# Patient Record
Sex: Male | Born: 1975 | Race: Asian | Hispanic: No | Marital: Married | Smoking: Never smoker
Health system: Southern US, Community
[De-identification: ages and names within clinical notes are randomized; demographics above are authoritative.]

## PROBLEM LIST (undated history)

## (undated) DIAGNOSIS — Z87898 Personal history of other specified conditions: Secondary | ICD-10-CM

## (undated) HISTORY — DX: Personal history of other specified conditions: Z87.898

## (undated) HISTORY — PX: NO PAST SURGERIES: SHX2092

---

## 2006-02-06 ENCOUNTER — Encounter: Payer: Self-pay | Admitting: Internal Medicine

## 2006-12-13 ENCOUNTER — Ambulatory Visit: Payer: Self-pay | Admitting: Internal Medicine

## 2006-12-13 DIAGNOSIS — R634 Abnormal weight loss: Secondary | ICD-10-CM

## 2006-12-16 ENCOUNTER — Encounter (INDEPENDENT_AMBULATORY_CARE_PROVIDER_SITE_OTHER): Payer: Self-pay | Admitting: *Deleted

## 2007-09-07 ENCOUNTER — Ambulatory Visit: Payer: Self-pay | Admitting: Internal Medicine

## 2007-09-07 LAB — CONVERTED CEMR LAB
Bilirubin Urine: NEGATIVE
Ketones, urine, test strip: NEGATIVE
Specific Gravity, Urine: <1.005
Urobilinogen, UA: NEGATIVE

## 2007-09-11 LAB — CONVERTED CEMR LAB
AST: 17 units/L (ref 0–37)
Basophils Absolute: 0 10*3/uL (ref 0.0–0.1)
Bilirubin, Direct: 0.1 mg/dL (ref 0.0–0.3)
Cholesterol: 165 mg/dL (ref 0–200)
Eosinophils Absolute: 0.2 10*3/uL (ref 0.0–0.6)
GFR calc Af Amer: 127 mL/min
GFR calc non Af Amer: 105 mL/min
Glucose, Bld: 86 mg/dL (ref 70–99)
HDL: 43.5 mg/dL (ref >39.0)
Hemoglobin: 15.1 g/dL (ref 13.0–17.0)
Iron: 135 ug/dL (ref 42–165)
LDL Cholesterol: 98 mg/dL (ref 0–99)
Lymphocytes Relative: 40.1 % (ref 12.0–46.0)
MCHC: 32.8 g/dL (ref 30.0–36.0)
Monocytes Absolute: 0.4 10*3/uL (ref 0.2–0.7)
Monocytes Relative: 8.8 % (ref 3.0–11.0)
Neutro Abs: 2.2 10*3/uL (ref 1.4–7.7)
Saturation Ratios: 32.6 % (ref 20.0–50.0)
Sed Rate: 3 mm/hr (ref 0–20)
Sodium: 140 meq/L (ref 135–145)
TSH: 0.68 microintl units/mL (ref 0.35–5.50)
Vitamin B-12: 541 pg/mL (ref 211–911)

## 2007-09-12 ENCOUNTER — Encounter (INDEPENDENT_AMBULATORY_CARE_PROVIDER_SITE_OTHER): Payer: Self-pay | Admitting: *Deleted

## 2007-09-14 ENCOUNTER — Encounter (INDEPENDENT_AMBULATORY_CARE_PROVIDER_SITE_OTHER): Payer: Self-pay | Admitting: *Deleted

## 2007-10-04 ENCOUNTER — Telehealth (INDEPENDENT_AMBULATORY_CARE_PROVIDER_SITE_OTHER): Payer: Self-pay | Admitting: *Deleted

## 2007-10-04 ENCOUNTER — Ambulatory Visit: Payer: Self-pay | Admitting: Gastroenterology

## 2007-10-11 ENCOUNTER — Encounter (INDEPENDENT_AMBULATORY_CARE_PROVIDER_SITE_OTHER): Payer: Self-pay | Admitting: *Deleted

## 2007-10-12 ENCOUNTER — Ambulatory Visit: Payer: Self-pay | Admitting: Gastroenterology

## 2007-10-12 LAB — CONVERTED CEMR LAB
Fecal Occult Blood: NEGATIVE
OCCULT 2: NEGATIVE
OCCULT 3: NEGATIVE

## 2008-07-09 ENCOUNTER — Ambulatory Visit: Payer: Self-pay | Admitting: Internal Medicine

## 2009-05-06 IMAGING — CR DG CHEST 2V
3 series · 3 of 3 positions shown · non-contrast
Comparison: None.

CLINICAL DATA: Weight loss. 
 CHEST - 2 VIEW ? 09/07/07:

[view not recorded (1 of 3)]
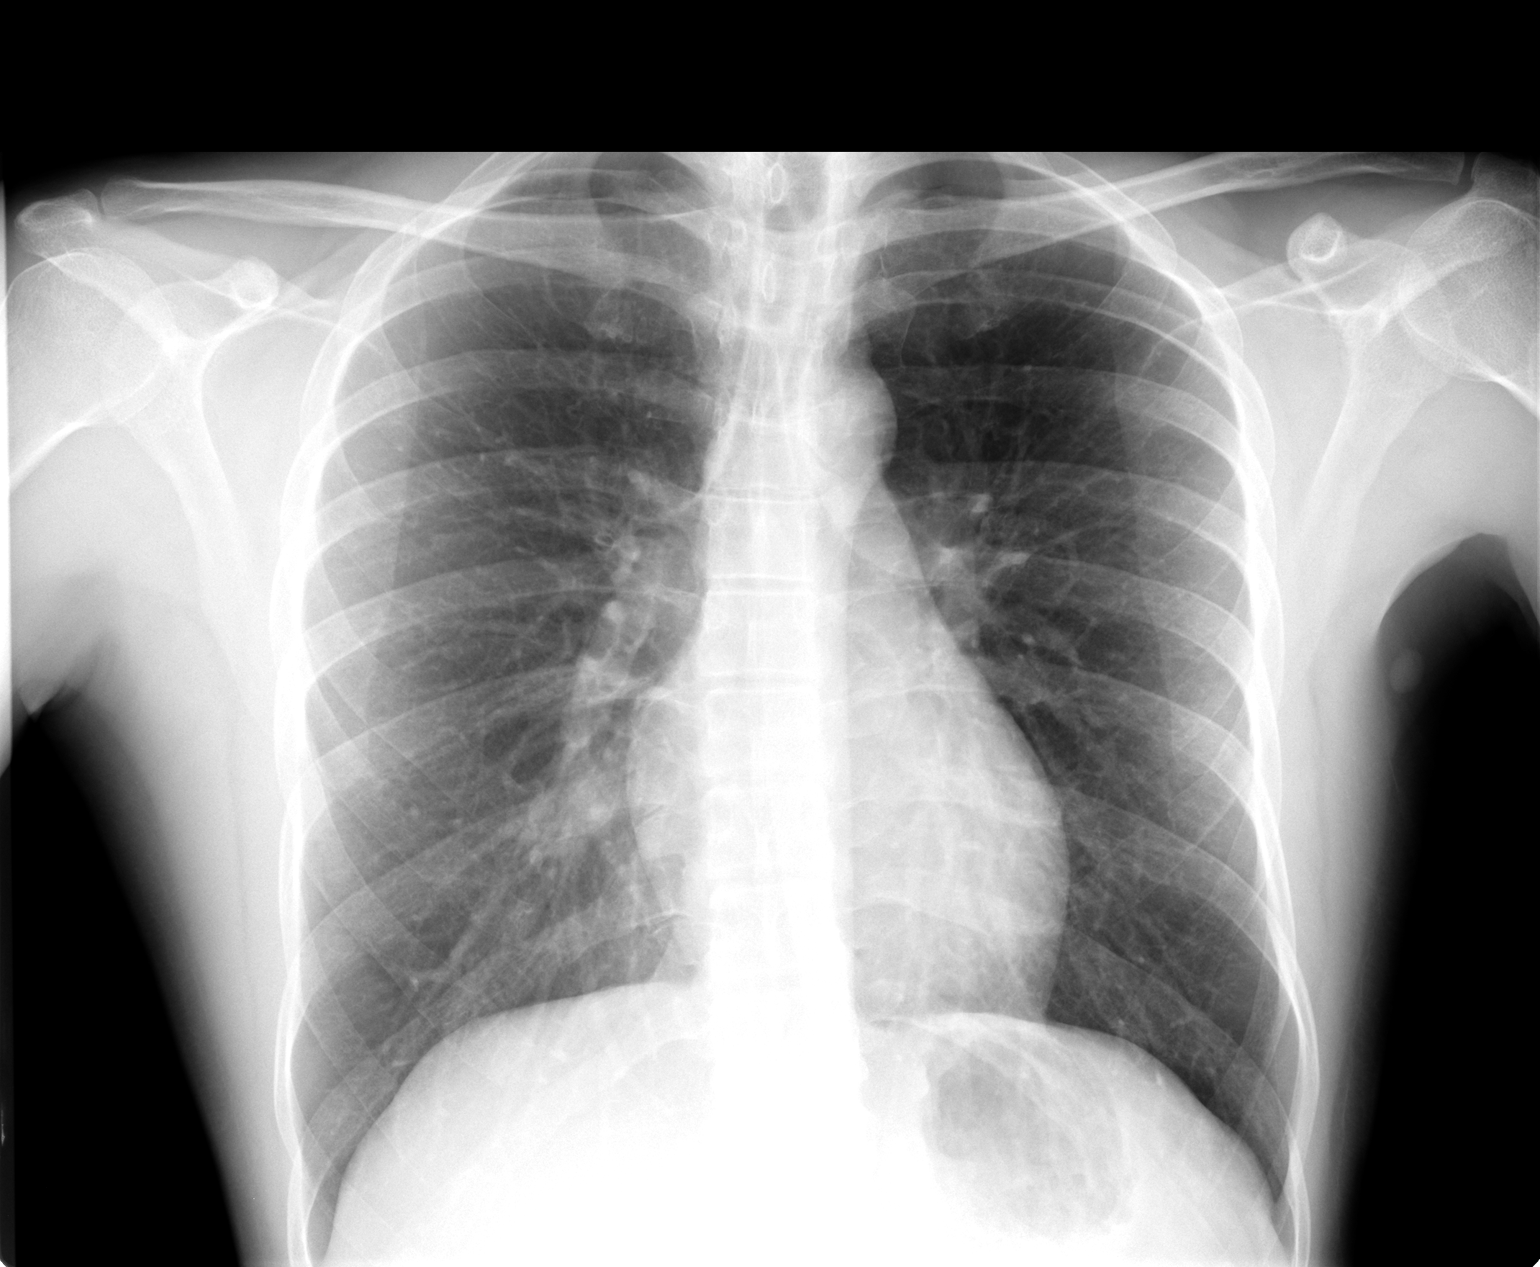

[view not recorded (2 of 3)]
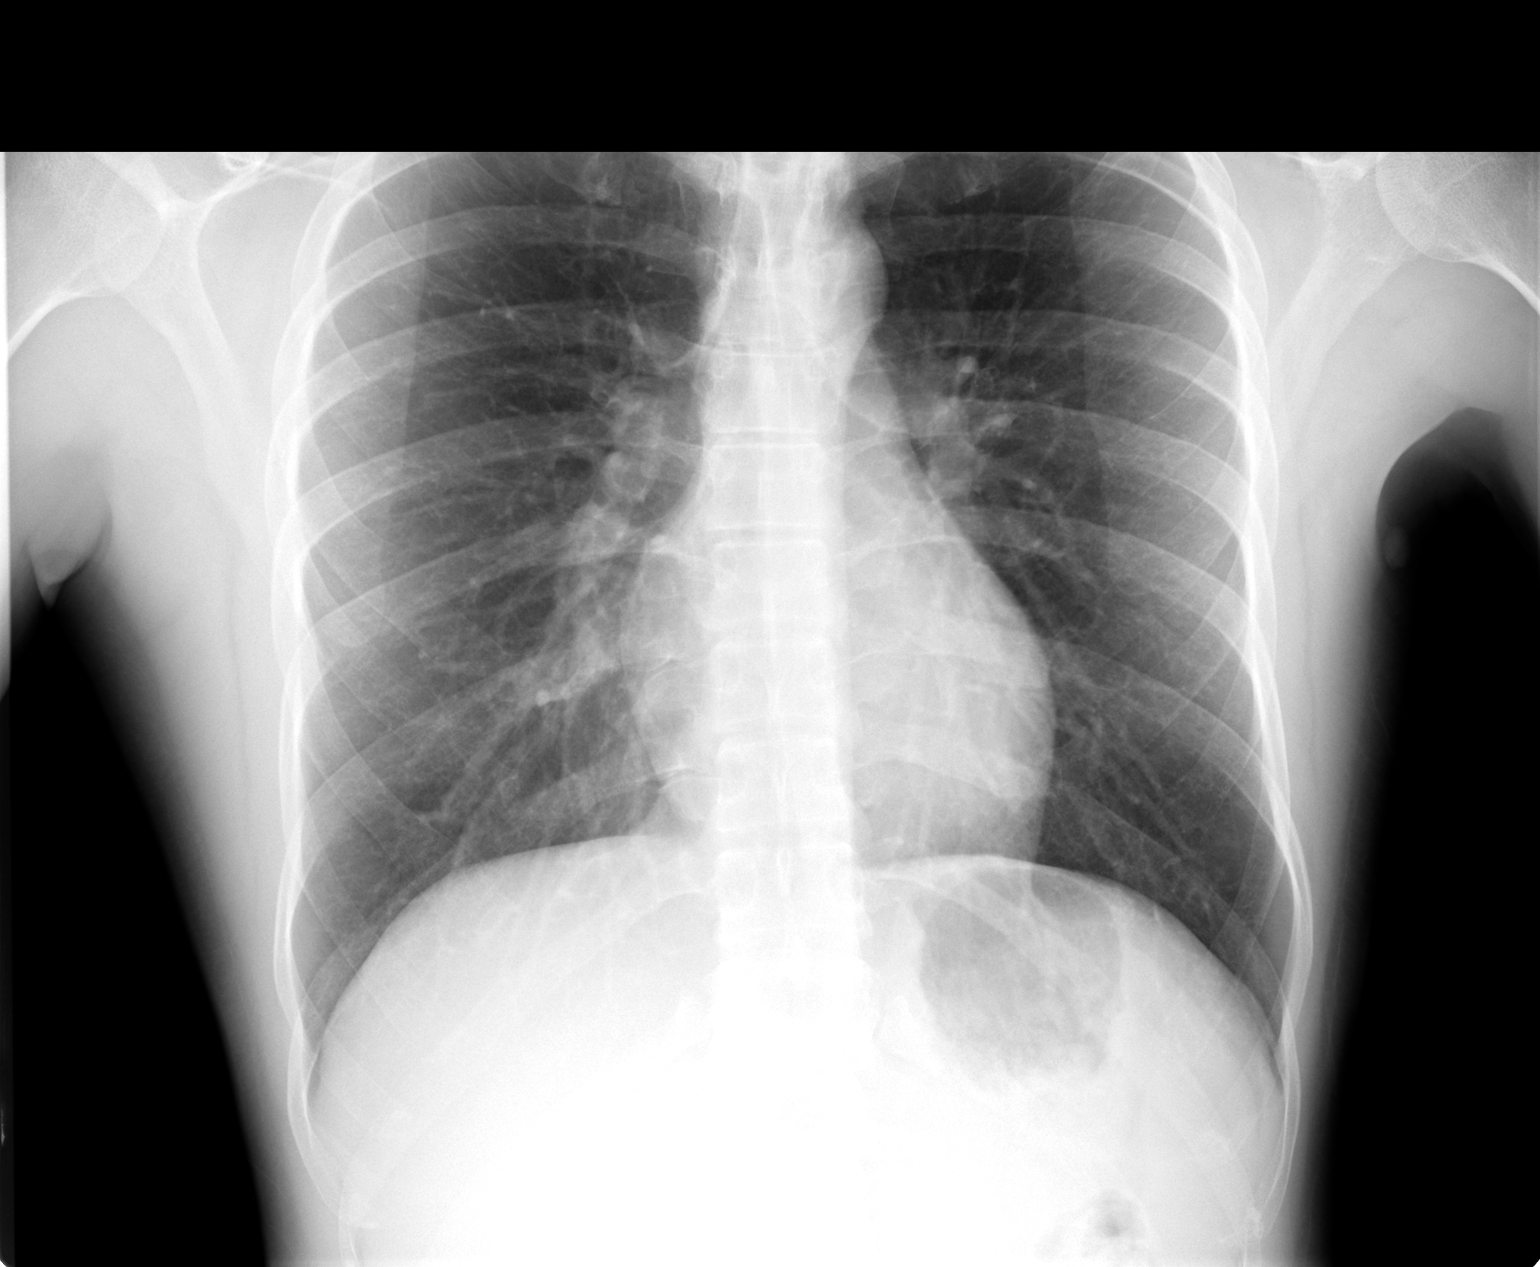

[view not recorded (3 of 3)]
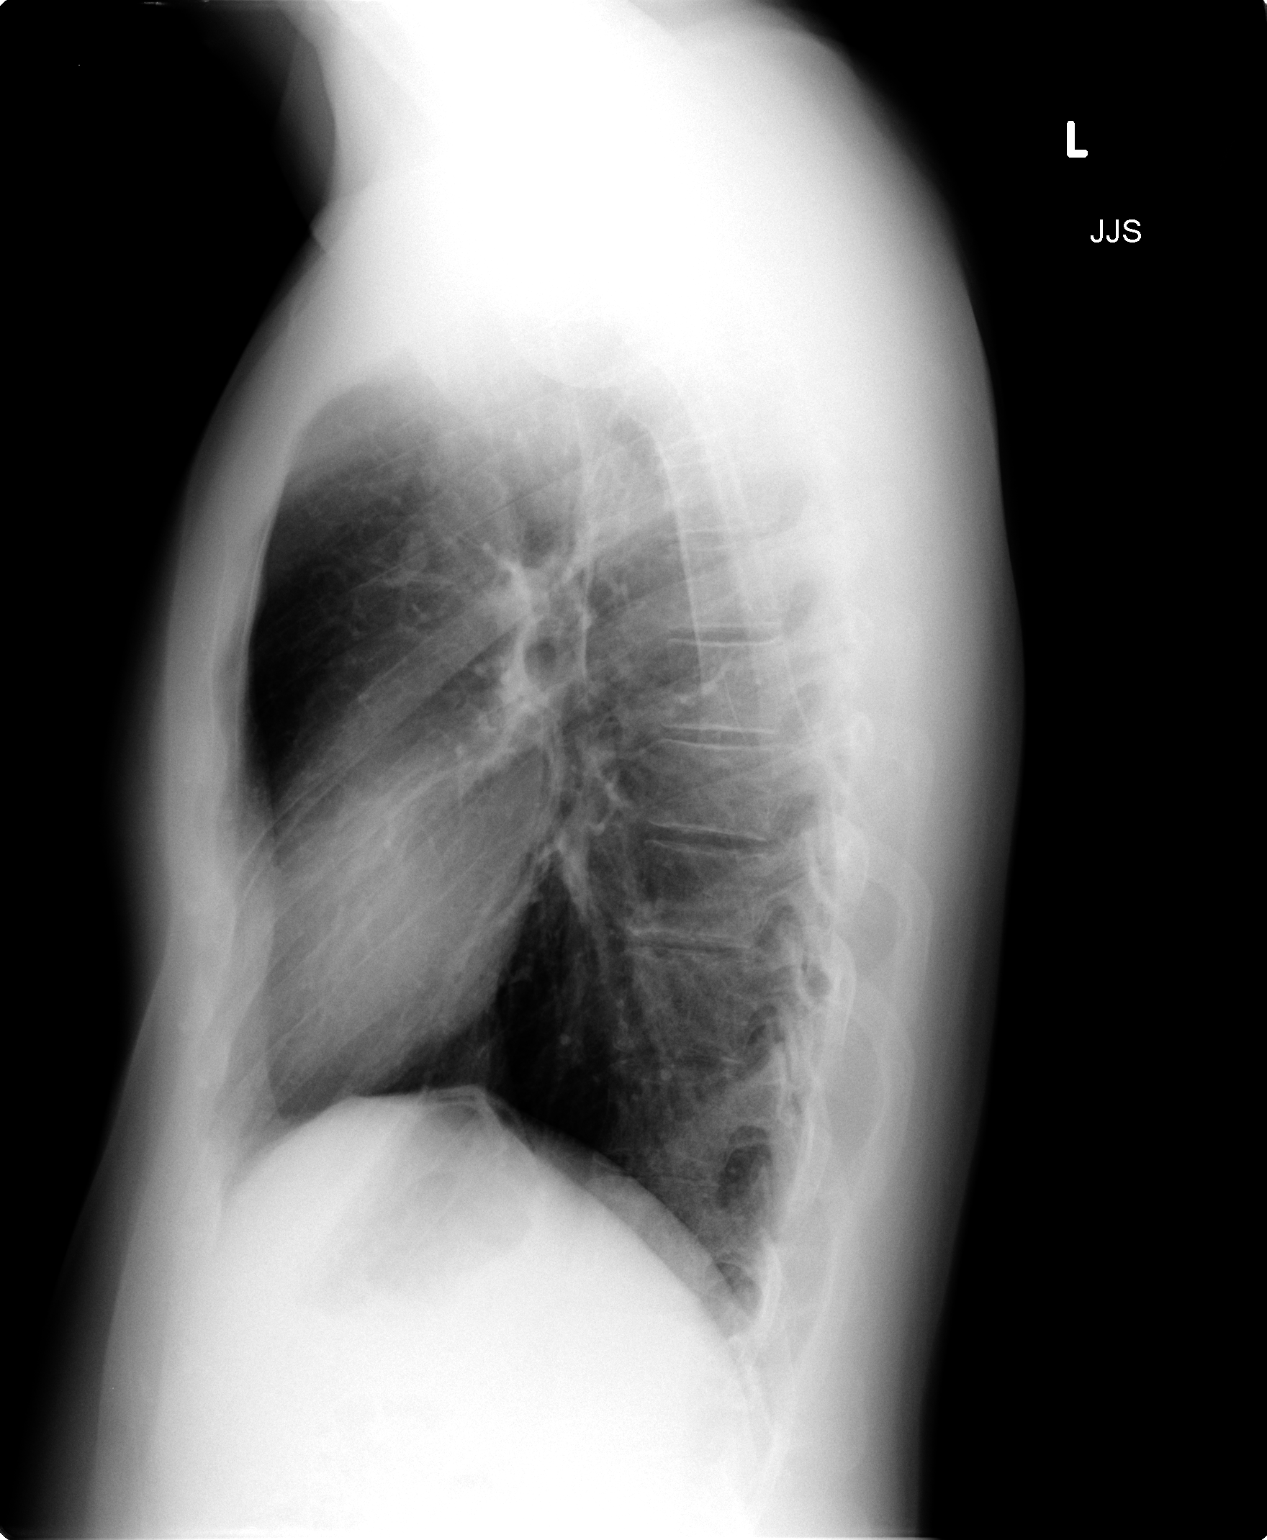

[3 of 3 positions shown; findings below may reference images not displayed]

FINDINGS: The heart size is normal.  The mediastinum is unremarkable.  The lungs are clear. No effusions.  Bony structures are unremarkable.
IMPRESSION: Normal chest.

## 2010-04-01 ENCOUNTER — Ambulatory Visit: Payer: Self-pay | Admitting: Internal Medicine

## 2010-04-06 ENCOUNTER — Ambulatory Visit: Payer: Self-pay | Admitting: Internal Medicine

## 2010-04-09 LAB — CONVERTED CEMR LAB
AST: 19 units/L (ref 0–37)
Basophils Relative: 0.7 % (ref 0.0–3.0)
CO2: 28 meq/L (ref 19–32)
Calcium: 9.5 mg/dL (ref 8.4–10.5)
Eosinophils Relative: 3.8 % (ref 0.0–5.0)
GFR calc non Af Amer: 99.94 mL/min (ref >60)
HDL: 45.4 mg/dL (ref >39.00)
Hemoglobin: 14.6 g/dL (ref 13.0–17.0)
LDL Cholesterol: 109 mg/dL — ABNORMAL HIGH (ref 0–99)
Lymphocytes Relative: 44.9 % (ref 12.0–46.0)
MCHC: 34.5 g/dL (ref 30.0–36.0)
Monocytes Relative: 10.5 % (ref 3.0–12.0)
Neutro Abs: 1.7 10*3/uL (ref 1.4–7.7)
RBC: 4.88 M/uL (ref 4.22–5.81)
Sodium: 140 meq/L (ref 135–145)
TSH: 0.62 microintl units/mL (ref 0.35–5.50)
Total CHOL/HDL Ratio: 4
WBC: 4.3 10*3/uL — ABNORMAL LOW (ref 4.5–10.5)

## 2010-07-22 NOTE — Assessment & Plan Note (Signed)
Summary: cpx/cbs   Vital Signs:  Patient profile:   35 year old male Height:      68.5 inches Weight:      146 pounds BMI:     21.96 Pulse rate:   77 / minute Pulse rhythm:   regular BP sitting:   112 / 82  (left arm) Cuff size:   regular  Vitals Entered By: Army Fossa CMA (April 01, 2010 1:51 PM) CC: CPX, not fasting, no complaints Comments flu shot [facility]    History of Present Illness: CPX  Current Medications (verified): 1)  None  Allergies (verified): No Known Drug Allergies  Past History:  Past Medical History: 2009- weight loss, positive Hemoccult:  extensive GI workup negative per patient, saw  Dr. Noe Gens in [facility] Negative colonoscopy and EGD  Past Surgical History: Reviewed history from 09/07/2007 and no changes required. no  Family History: Father: good health Mother: good health MI--no DM--no colon ca--no prostate ca--no  Social History: Married 2  child original from [place] Moved here in 2000 tobacco--no ETOH-- socially  exercise--no works at [employer]  Review of Systems       2 years ago was loosing wt , patient came to the conclusion it was stress d/t work, now has a more balance schedule and doing better  CV:  Denies chest pain or discomfort, palpitations, and swelling of feet. Resp:  Denies cough and shortness of breath. GI:  Denies bloody stools, diarrhea, nausea, and vomiting; occasionally heartburn . GU:  Denies dysuria, hematuria, urinary frequency, and urinary hesitancy.  Physical Exam  General:  alert, well-developed, and well-nourished.   Neck:  no masses, no thyromegaly, and normal carotid upstroke.   Lungs:  normal respiratory effort, no intercostal retractions, no accessory muscle use, and normal breath sounds.   Heart:  normal rate, regular rhythm, no murmur, and no gallop.   Abdomen:  soft, non-tender, no distention, no masses, no guarding, no rigidity, and no rebound tenderness.   Rectal:  No  external abnormalities noted. Normal sphincter tone. No rectal masses or tenderness. Prostate:  Prostate gland firm and smooth, no enlargement, nodularity, tenderness, mass, asymmetry or induration. Extremities:  no edema   Impression & Recommendations:  Problem # 1:  HEALTH SCREENING (ICD-V70.0) Td 07 got a flu shot already  Doing great prostate was enlarged in 2009, DRE normal today  Labs Information provided about diet. Encouraged to exercise 30 minutes daily  Problem # 2:  WEIGHT LOSS (ICD-783.21) history of weight loss and a positive Hemoccult in 2009 saw a GI in [facility], workup was negative. Weight loss resolved, patient thinks it was related to stress  Other Orders: Admin 1st Vaccine (16109) Flu Vaccine 53yrs + (60454)  Patient Instructions: 1)  please came back  fasting 2)  FLP, BMP, CBC, AST, ALT, TSH----dx v70 3)  Please schedule a follow-up appointment in 1 year.    Flu Vaccine Consent Questions     Do you have a history of severe allergic reactions to this vaccine? no    Any prior history of allergic reactions to egg and/or gelatin? no    Do you have a sensitivity to the preservative Thimersol? no    Do you have a past history of Guillan-Barre Syndrome? no    Do you currently have an acute febrile illness? no    Have you ever had a severe reaction to latex? no    Vaccine information given and explained to patient? yes  Are you currently pregnant? no    Lot Number:[id number]   Exp Date:12/19/2010   Site Given  Right Deltoid IM

## 2010-11-03 NOTE — Letter (Signed)
October 04, 2007    Vikki Ports   RE:  ODEL, SCHMID  MRN:  [id number]  /  DOB:  [birth year]   Dear Mr. Torrie Mayers:   It is my pleasure to have treated you recently as a new patient in my  office.  I appreciate your confidence and the opportunity to participate  in your care.   Since I do have a busy inpatient endoscopy schedule and office schedule,  my office hours vary weekly.  I am, however, available for emergency  calls every day through my office.  If I cannot promptly meet an urgent  office appointment, another one of our gastroenterologists will be able  to assist you.   My well-trained staff are prepared to help you at all times.  For  emergencies after office hours, a physician from our gastroenterology  section is always available through my 24-hour answering service.   While you are under my care, I encourage discussion of your questions  and concerns, and I will be happy to return your calls as soon as I am  available.   Once again, I welcome you as a new patient and I look forward to a happy  and healthy relationship.    Sincerely,      Barbette Hair. Arlyce Dice, MD,FACG  Electronically Signed   RDK/MedQ  DD: 10/04/2007  DT: 10/04/2007  Job #: [id number]

## 2010-11-03 NOTE — Letter (Signed)
October 04, 2007    Willow Ora, MD  [phone] [place], [place] [address]   RE:  JAKYE, MULLENS  MRN:  [id number]  /  DOB:  12-Jul-1975   Dear Dr. Drue Novel:   Upon your kind referral, I had the pleasure of evaluating your patient  and I am pleased to offer my findings.  I saw Mr. Briseno in the  office today.  Enclosed is a copy of my progress note that details my  findings and recommendations.   Thank you for the opportunity to participate in your patient's care.    Sincerely,      Barbette Hair. Arlyce Dice, MD,FACG  Electronically Signed    RDK/MedQ  DD: 10/04/2007  DT: 10/04/2007  Job #: [id number]

## 2010-11-03 NOTE — Assessment & Plan Note (Signed)
[facility]                         GASTROENTEROLOGY OFFICE NOTE   MEKHI, SONN                   MRN:          [id number]  DATE:10/04/2007                            DOB:          [birth year]    REASON FOR CONSULTATION:  Heme-positive stool and weight loss.   Mr. Patrick Berry is a pleasant 35 year old [place] male referred through  the courtesy of Dr. Drue Novel for evaluation.  His main complaint is weight  loss.  Over the past two years, he has lost 20 pounds.  He is uncertain  why.  He has no GI complaints including abdominal pain, nausea, pyrosis  or change of bowel habits.  Rarely he sees blood on the toilet tissue  when he eats spicy foods.   Laboratory including CBC, CMET and thyroid function tests ordered by Dr.  Drue Novel were unremarkable.  He was Hemoccult positive by Dr. Leta Jungling exam.  Appetite is excellent.  Altogether, he is feeling well.   PAST MEDICAL HISTORY:  Unremarkable.   FAMILY HISTORY:  Noncontributory.   MEDICATIONS:  He is on no medications.   ALLERGIES:  None.   SOCIAL HISTORY:  He does not smoke, he rarely drinks.  He is married.  He works as an Art gallery manager.   REVIEW OF SYSTEMS:  Reviewed and negative.   PHYSICAL EXAMINATION:  VITAL SIGNS:  Pulse 80, blood pressure 100/70,  weight 137.  HEENT:  EOMI.  PERRLA.  Sclerae are anicteric.  Conjunctivae are pink.  NECK:  Supple without thyromegaly, adenopathy or carotid bruits.  CHEST:  Clear to auscultation and percussion without adventitious  sounds.  CARDIAC:  Regular rhythm; normal S1 S2.  There are no murmurs, gallops  or rubs.  ABDOMEN:  Bowel sounds are normoactive.  Abdomen is soft, nontender and  nondistended.  There are no abdominal masses, tenderness, splenic  enlargement or hepatomegaly.  EXTREMITIES:  Full range of motion.  No cyanosis, clubbing or edema.  RECTAL:  Deferred.  MUSCULOSKELETAL:  No abnormalities.  NEUROLOGIC:  Nonfocal.  Patient is oriented x3.   IMPRESSION:  1. Weight loss.  Etiology for this is not apparent.  There are no      obvious metabolic abnormalities by his lab examination.      Malabsorption ought to be considered, though, again, there is no      evidence by his numbers.  Hyperthyroidism has been ruled out.  2. Hemoccult positive stool.  This could be due to hemorrhoidal      bleeding, though a more possible colonic bleeding source should be      ruled out as well.  I think it is unlikely that his heme-positive      stool is related to his weight loss.   RECOMMENDATIONS:  1. Check celiac panel.  2. Colonoscopy.  3. Follow up Hemoccults.     Barbette Hair. Arlyce Dice, MD,FACG  Electronically Signed    RDK/MedQ  DD: 10/04/2007  DT: 10/04/2007  Job #: [id number]   cc:   Willow Ora, MD

## 2011-09-01 ENCOUNTER — Ambulatory Visit (INDEPENDENT_AMBULATORY_CARE_PROVIDER_SITE_OTHER): Payer: Managed Care, Other (non HMO) | Admitting: Internal Medicine

## 2011-09-01 ENCOUNTER — Encounter: Payer: Self-pay | Admitting: Internal Medicine

## 2011-09-01 VITALS — BP 108/70 | HR 80 | Temp 98.4°F | Wt 147.0 lb

## 2011-09-01 DIAGNOSIS — J029 Acute pharyngitis, unspecified: Secondary | ICD-10-CM

## 2011-09-01 MED ORDER — OSELTAMIVIR PHOSPHATE 75 MG PO CAPS: 75.0000 mg | ORAL_CAPSULE | Freq: Two times a day (BID) | ORAL | Status: AC

## 2011-09-01 NOTE — Progress Notes (Signed)
  Subjective:    Patient ID: Patrick Berry, male    DOB: [birth year], 36 y.o.   MRN: [id number]  HPI Respiratory tract infection Onset/symptoms:Began yesterday as body ache and fatigue, with fever of 100 in PM Exposures (illness/environmental/extrinsic):Does have children with recent bacterial ear infection Progression of symptoms: Initially body ache and fatigue, with onset of fever and sore throat in PM Treatments/response:Took Ibuprofen which alleviated fever Present symptoms:Generalized body ache and fatigue with sore throat Fever/chills/sweats:No fever at this time, but he reported fever of 100 last night Frontal headache:No Facial pain:No Nasal purulence:No Sore throat:Yes Dental pain:No Lymphadenopathy:No Wheezing/shortness of breath:No Cough/sputum/hemoptysis:None Pleuritic pain:No Associated extrinsic/allergic symptoms:itchy eyes/ sneezing:No Past medical history: Seasonal allergies/asthma: No Smoking history:Never smoked           Review of Systems No hematuria, dysuria, or pyuria; No diarrhea. No meningismus     Objective:   Physical Exam General appearance:thin but in good health ;well nourished; no acute distress or increased work of breathing is present.  No  lymphadenopathy about the head, neck, or axilla noted.  Eyes: No conjunctival inflammation or lid edema is present. Ears:  External ear exam shows no significant lesions or deformities.  Otoscopic examination reveals clear canals, tympanic membranes are intact bilaterally without bulging, retraction, inflammation or discharge. Nose:  External nasal examination shows no deformity or inflammation. Nasal mucosa are pink and moist without lesions or exudates. No septal dislocation or deviation.No obstruction to airflow.  Oral exam: Dental hygiene is good; lips and gums are healthy appearing.There is oropharyngeal erythema, but exudate noted.  Neck:  No deformities, masses, or tenderness noted.   Supple with  full range of motion without pain.  Heart:  Normal rate and regular rhythm. S1 and S2 normal without gallop, murmur, click, rub or other extra sounds.  Lungs:Chest clear to auscultation; no wheezes, rhonchi,rales ,or rubs present.No increased work of breathing.   Extremities:  No cyanosis, edema, or clubbing  noted  Skin: Warm & dry w/o jaundice or tenting.           Assessment & Plan:  #1 fever with myalgias and arthralgias  #2 pharyngitis  Plan: See orders and recommendations

## 2011-09-01 NOTE — Patient Instructions (Signed)
Zicam Melts or Zinc lozenges ; vitamin C 2000 mg daily; & Echinacea for 4-7 days. Report fever, exudate("pus") or progressive pain. Nasal cleansing in the shower as discussed. Make sure that all residual soap is removed to prevent irritation.

## 2014-06-24 LAB — HEMOGLOBIN A1C: Hgb A1c MFr Bld: 5.6 % (ref 4.0–6.0)

## 2014-06-24 LAB — LIPID PANEL: LDl/HDL Ratio: 3.6

## 2014-06-24 LAB — BASIC METABOLIC PANEL
BUN: 10 mg/dL (ref 4–21)
Creatinine: 1 mg/dL (ref <=1.3)
GLUCOSE: 81 mg/dL

## 2014-06-24 LAB — HEPATIC FUNCTION PANEL
ALT: 99 U/L — AB (ref 10–40)
AST: 29 U/L (ref 14–40)
Alkaline Phosphatase: 51 U/L (ref 25–125)

## 2014-08-29 ENCOUNTER — Telehealth: Payer: Self-pay | Admitting: Internal Medicine

## 2014-08-29 DIAGNOSIS — Z0000 Encounter for general adult medical examination without abnormal findings: Secondary | ICD-10-CM

## 2014-08-29 NOTE — Telephone Encounter (Signed)
Please advise 

## 2014-08-29 NOTE — Telephone Encounter (Signed)
Caller name: Vikki Portsrabaharan, [place] Relation to pt: self  Call back number: [phone] # [phone]  Reason for call:  Pt is requesting labs before he's follow up appontment on 09/02/14. Advised pt labs may be done at the time of appointment. Pt insisted having labs done prior to appoitment. Please advise

## 2014-08-29 NOTE — Telephone Encounter (Signed)
Okay per Dr. Drue NovelPaz, labs ordered. Pt needs a FASTING lab appt.

## 2014-08-29 NOTE — Telephone Encounter (Signed)
Pt scheduled fasting labs for 08/30/14.

## 2014-08-29 NOTE — Telephone Encounter (Signed)
If he is here for a physical exam please arrange the following: FLP, CMP, CBC, TSH ---DX CPX

## 2014-08-30 ENCOUNTER — Other Ambulatory Visit (INDEPENDENT_AMBULATORY_CARE_PROVIDER_SITE_OTHER): Payer: Managed Care, Other (non HMO)

## 2014-08-30 DIAGNOSIS — Z0000 Encounter for general adult medical examination without abnormal findings: Secondary | ICD-10-CM

## 2014-08-30 LAB — CBC WITH DIFFERENTIAL/PLATELET
BASOS PCT: 0.5 % (ref 0.0–3.0)
Basophils Absolute: 0 10*3/uL (ref 0.0–0.1)
EOS ABS: 0.2 10*3/uL (ref 0.0–0.7)
EOS PCT: 3.4 % (ref 0.0–5.0)
HEMATOCRIT: 42.7 % (ref 39.0–52.0)
Hemoglobin: 14.7 g/dL (ref 13.0–17.0)
LYMPHS ABS: 2 10*3/uL (ref 0.7–4.0)
Lymphocytes Relative: 41.9 % (ref 12.0–46.0)
MCHC: 34.5 g/dL (ref 30.0–36.0)
MCV: 82.9 fl (ref 78.0–100.0)
MONO ABS: 0.4 10*3/uL (ref 0.1–1.0)
Monocytes Relative: 8.7 % (ref 3.0–12.0)
Neutro Abs: 2.2 10*3/uL (ref 1.4–7.7)
Neutrophils Relative %: 45.5 % (ref 43.0–77.0)
PLATELETS: 233 10*3/uL (ref 150.0–400.0)
RBC: 5.15 Mil/uL (ref 4.22–5.81)
RDW: 12.6 % (ref 11.5–15.5)
WBC: 4.8 10*3/uL (ref 4.0–10.5)

## 2014-08-30 LAB — LIPID PANEL
Cholesterol: 162 mg/dL (ref 0–200)
HDL: 42.1 mg/dL (ref >39.00)
LDL Cholesterol: 98 mg/dL (ref 0–99)
NonHDL: 119.9
Total CHOL/HDL Ratio: 4
Triglycerides: 110 mg/dL (ref 0.0–149.0)
VLDL: 22 mg/dL (ref 0.0–40.0)

## 2014-08-30 LAB — COMPREHENSIVE METABOLIC PANEL
ALT: 16 U/L (ref 0–53)
AST: 15 U/L (ref 0–37)
Albumin: 4.4 g/dL (ref 3.5–5.2)
Alkaline Phosphatase: 43 U/L (ref 39–117)
BUN: 16 mg/dL (ref 6–23)
CALCIUM: 9.9 mg/dL (ref 8.4–10.5)
CHLORIDE: 102 meq/L (ref 96–112)
CO2: 32 meq/L (ref 19–32)
Creatinine, Ser: 1.05 mg/dL (ref 0.40–1.50)
GFR: 83.72 mL/min (ref >60.00)
Glucose, Bld: 99 mg/dL (ref 70–99)
Potassium: 4.8 mEq/L (ref 3.5–5.1)
Sodium: 137 mEq/L (ref 135–145)
TOTAL PROTEIN: 7.2 g/dL (ref 6.0–8.3)
Total Bilirubin: 0.5 mg/dL (ref 0.2–1.2)

## 2014-08-30 LAB — TSH: TSH: 0.68 u[IU]/mL (ref 0.35–4.50)

## 2014-09-02 ENCOUNTER — Ambulatory Visit (INDEPENDENT_AMBULATORY_CARE_PROVIDER_SITE_OTHER): Payer: Managed Care, Other (non HMO) | Admitting: Internal Medicine

## 2014-09-02 VITALS — BP 116/62 | HR 74 | Temp 98.0°F | Ht 69.5 in | Wt 161.5 lb

## 2014-09-02 DIAGNOSIS — Z0000 Encounter for general adult medical examination without abnormal findings: Secondary | ICD-10-CM

## 2014-09-02 NOTE — Progress Notes (Signed)
Pre visit review using our clinic review tool, if applicable. No additional management support is needed unless otherwise documented below in the visit note. 

## 2014-09-02 NOTE — Patient Instructions (Signed)
Next visit 1 year 

## 2014-09-02 NOTE — Assessment & Plan Note (Addendum)
Td 07 Few  weeks ago ALT was 99, recent labs are all normal including the ALT. Furthermore, he was negative for hepatitis C and B. He does not take Tylenol and drinks very sporadically. Plan is to repeat LFTs when he comes back once a year. Recommend to get in the habit of exercise regularly and continue eating healthy  Labs 06/24/2014 (will be scanned): A1c 5.6, creatinine 1.0, alkaline phosphatase 51 normal, bilirubin normal, AST 29, ALT 99 (0-45), GGT normal. Cholesterol 186 Hepatitis C screen negative, hepatitis B surface antigen negative, HIV negative.

## 2014-09-02 NOTE — Progress Notes (Signed)
Subjective:    Patient ID: Patrick Berry, male    DOB: 1975-10-05, 39 y.o.   MRN: [id number]  DOS:  09/02/2014 Type of visit - description : Request a physical exam Interval history: Concern about blood work he had 06/24/2014, ALT was 99 which is elevated. Denies taking Tylenol, drinks very rarely.   Review of Systems Constitutional: No fever, chills. No unexplained wt changes. No unusual sweats HEENT: No dental problems, ear discharge, facial swelling, voice changes. No eye discharge, redness or intolerance to light Respiratory: No wheezing or difficulty breathing. No cough , mucus production Cardiovascular: No CP, leg swelling or palpitations GI: no nausea, vomiting, diarrhea or abdominal pain.  No blood in the stools. No dysphagia   Endocrine: No polyphagia, polyuria or polydipsia GU: No dysuria, gross hematuria, difficulty urinating. No urinary urgency or frequency. Musculoskeletal: No joint swellings or unusual aches or pains Skin: No change in the color of the skin, palor or rash Allergic, immunologic: No environmental allergies or food allergies Neurological: No dizziness or syncope. No headaches. No diplopia, slurred speech, motor deficits, facial numbness Hematological: No enlarged lymph nodes, easy bruising or bleeding Psychiatry: No suicidal ideas, hallucinations, behavior problems or confusion. No unusual/severe anxiety or depression.     Past Medical History  Diagnosis Date  . H/O abnormal weight loss     Past Surgical History  Procedure Laterality Date  . No past surgeries      History   Social History  . Marital Status: Married    Spouse Name: N/A  . Number of Children: 2  . Years of Education: N/A   Occupational History  . [employer]-- Art gallery manager    Social History Main Topics  . Smoking status: Never Smoker   . Smokeless tobacco: Never Used  . Alcohol Use: 0.0 oz/week    0 Standard drinks or equivalent per week     Comment: occasionally   .  Drug Use: No  . Sexual Activity: Not on file   Other Topics Concern  . Not on file   Social History Narrative   Original from [place]     Family History  Problem Relation Age of Onset  . Diabetes Neg Hx   . CAD Neg Hx   . Stroke Neg Hx   . Colon cancer Neg Hx   . Prostate cancer Neg Hx        Medication List    Notice  As of 09/02/2014  6:31 PM   You have not been prescribed any medications.         Objective:   Physical Exam BP 116/62 mmHg  Pulse 74  Temp(Src) 98 F (36.7 C) (Oral)  Ht 5' 9.5" (1.765 m)  Wt 161 lb 8 oz (73.256 kg)  BMI 23.52 kg/m2  SpO2 95% General:   Well developed, well nourished . NAD.  Neck:  Full range of motion. Supple. No  thyromegaly , normal carotid pulse HEENT:  Normocephalic . Face symmetric, atraumatic Lungs:  CTA B Normal respiratory effort, no intercostal retractions, no accessory muscle use. Heart: RRR,  no murmur.  Abdomen:  Not distended, soft, non-tender. No rebound or rigidity. No mass,organomegaly Muscle skeletal: no pretibial edema bilaterally  Skin: Exposed areas without rash. Not pale. Not jaundice Neurologic:  alert & oriented X3.  Speech normal, gait appropriate for age and unassisted Strength symmetric and appropriate for age.  Psych: Cognition and judgment appear intact.  Cooperative with normal attention span and concentration.  Behavior appropriate. No anxious  or depressed appearing.       Assessment & Plan:

## 2016-01-09 ENCOUNTER — Encounter: Payer: Self-pay | Admitting: Internal Medicine

## 2016-01-12 ENCOUNTER — Encounter: Payer: Managed Care, Other (non HMO) | Admitting: Internal Medicine

## 2016-03-02 ENCOUNTER — Ambulatory Visit (INDEPENDENT_AMBULATORY_CARE_PROVIDER_SITE_OTHER): Payer: Managed Care, Other (non HMO) | Admitting: Internal Medicine

## 2016-03-02 ENCOUNTER — Encounter: Payer: Self-pay | Admitting: Internal Medicine

## 2016-03-02 VITALS — BP 102/70 | HR 72 | Temp 97.8°F | Resp 12 | Ht 70.0 in | Wt 165.0 lb

## 2016-03-02 DIAGNOSIS — Z23 Encounter for immunization: Secondary | ICD-10-CM | POA: Diagnosis not present

## 2016-03-02 DIAGNOSIS — R399 Unspecified symptoms and signs involving the genitourinary system: Secondary | ICD-10-CM

## 2016-03-02 DIAGNOSIS — Z0000 Encounter for general adult medical examination without abnormal findings: Secondary | ICD-10-CM | POA: Diagnosis not present

## 2016-03-02 LAB — URINALYSIS, ROUTINE W REFLEX MICROSCOPIC
Bilirubin Urine: NEGATIVE
HGB URINE DIPSTICK: NEGATIVE
Ketones, ur: NEGATIVE
Leukocytes, UA: NEGATIVE
Nitrite: NEGATIVE
PH: 6 (ref 5.0–8.0)
RBC / HPF: NONE SEEN (ref 0–?)
Specific Gravity, Urine: <=1.005 — AB (ref 1.000–1.030)
TOTAL PROTEIN, URINE-UPE24: NEGATIVE
URINE GLUCOSE: NEGATIVE
Urobilinogen, UA: 0.2 (ref 0.0–1.0)
WBC, UA: NONE SEEN (ref 0–?)

## 2016-03-02 LAB — LIPID PANEL
Cholesterol: 182 mg/dL (ref 0–200)
HDL: 39.8 mg/dL (ref >39.00)
NONHDL: 142.43
Total CHOL/HDL Ratio: 5
Triglycerides: 304 mg/dL — ABNORMAL HIGH (ref 0.0–149.0)
VLDL: 60.8 mg/dL — AB (ref 0.0–40.0)

## 2016-03-02 LAB — COMPREHENSIVE METABOLIC PANEL
ALK PHOS: 43 U/L (ref 39–117)
ALT: 15 U/L (ref 0–53)
AST: 15 U/L (ref 0–37)
Albumin: 4.4 g/dL (ref 3.5–5.2)
BUN: 12 mg/dL (ref 6–23)
CO2: 34 mEq/L — ABNORMAL HIGH (ref 19–32)
Calcium: 9.1 mg/dL (ref 8.4–10.5)
Chloride: 103 mEq/L (ref 96–112)
Creatinine, Ser: 0.91 mg/dL (ref 0.40–1.50)
GFR: 98 mL/min (ref >60.00)
GLUCOSE: 93 mg/dL (ref 70–99)
POTASSIUM: 4 meq/L (ref 3.5–5.1)
SODIUM: 138 meq/L (ref 135–145)
TOTAL PROTEIN: 7.2 g/dL (ref 6.0–8.3)
Total Bilirubin: 0.4 mg/dL (ref 0.2–1.2)

## 2016-03-02 LAB — HEMOGLOBIN A1C: HEMOGLOBIN A1C: 5.4 % (ref 4.6–6.5)

## 2016-03-02 LAB — LDL CHOLESTEROL, DIRECT: Direct LDL: 111 mg/dL

## 2016-03-02 NOTE — Progress Notes (Signed)
Subjective:    Patient ID: Patrick Berry, male    DOB: Aug 04, 1975, 40 y.o.   MRN: [id number]  DOS:  03/02/2016 Type of visit - description : CPX Interval history: No major concerns, did notice some weight gain, he remains active and diet is healthy although there is room for improvement.  Wt Readings from Last 3 Encounters:  03/02/16 165 lb (74.8 kg)  09/02/14 161 lb 8 oz (73.3 kg)  09/01/11 147 lb (66.7 kg)     Review of Systems  Constitutional: No fever. No chills. No unexplained wt changes. No unusual sweats  HEENT: No dental problems, no ear discharge, no facial swelling, no voice changes. No eye discharge, no eye  redness , no  intolerance to light   Respiratory: No wheezing , no  difficulty breathing. No cough , no mucus production  Cardiovascular: No CP, no leg swelling , no  Palpitations  GI: no nausea, no vomiting, no diarrhea , no  abdominal pain.  No blood in the stools. No dysphagia, no odynophagia    Endocrine: No polyphagia, no polyuria , no polydipsia  GU:  Occasionally urinary urgency but no other symptoms. No nocturia.  No dysuria, gross hematuria, difficulty urinating. No urinary  frequency.  Musculoskeletal: No joint swellings or unusual aches or pains  Skin: No change in the color of the skin, palor , no  Rash  Allergic, immunologic: No environmental allergies , no  food allergies  Neurological: No dizziness no  syncope. No headaches. No diplopia, no slurred, no slurred speech, no motor deficits, no facial  Numbness  Hematological: No enlarged lymph nodes, no easy bruising , no unusual bleedings  Psychiatry: No suicidal ideas, no hallucinations, no beavior problems, no confusion.  No unusual/severe anxiety, no depression  Past Medical History:  Diagnosis Date  . H/O abnormal weight loss     Past Surgical History:  Procedure Laterality Date  . NO PAST SURGERIES      Social History   Social History  . Marital status: Married   Spouse name: N/A  . Number of children: 2  . Years of education: N/A   Occupational History  . [employer]-- Art gallery manager    Social History Main Topics  . Smoking status: Never Smoker  . Smokeless tobacco: Never Used  . Alcohol use 0.0 oz/week     Comment: occasionally   . Drug use: No  . Sexual activity: Not on file   Other Topics Concern  . Not on file   Social History Narrative   Original from [place]   Children 2010 and 2006     Family History  Problem Relation Age of Onset  . Diabetes Neg Hx   . CAD Neg Hx   . Stroke Neg Hx   . Colon cancer Neg Hx   . Prostate cancer Neg Hx        Medication List    as of 03/02/2016 11:59 PM   You have not been prescribed any medications.        Objective:   Physical Exam BP 102/70 (BP Location: Left Arm, Patient Position: Sitting, Cuff Size: Small)   Pulse 72   Temp 97.8 F (36.6 C) (Oral)   Resp 12   Ht 5\' 10"  (1.778 m)   Wt 165 lb (74.8 kg)   SpO2 97%   BMI 23.68 kg/m   General:   Well developed, well nourished . NAD.  Neck: No  thyromegaly  HEENT:  Normocephalic . Face symmetric, atraumatic  Lungs:  CTA B Normal respiratory effort, no intercostal retractions, no accessory muscle use. Heart: RRR,  no murmur.  No pretibial edema bilaterally  Abdomen:  Not distended, soft, non-tender. No rebound or rigidity.   Rectal:  External abnormalities: none. Normal sphincter tone. No rectal masses or tenderness.  No stools found Prostate: Prostate gland firm and smooth, no enlargement, nodularity, tenderness, mass, asymmetry or induration.  Skin: Exposed areas without rash. Not pale. Not jaundice Neurologic:  alert & oriented X3.  Speech normal, gait appropriate for age and unassisted Strength symmetric and appropriate for age.  Psych: Cognition and judgment appear intact.  Cooperative with normal attention span and concentration.  Behavior appropriate. No anxious or depressed appearing.    Assessment & Plan:     Assessment Increase LFTs 2016, hep B and C negative  Plan: H/o increase LFTs: Labs today Mild LUTS: DRE within normal, check a UA and urine culture, call if symptoms increase. RTC one year

## 2016-03-02 NOTE — Patient Instructions (Signed)
GO TO THE LAB : Get the blood work     GO TO THE FRONT DESK Schedule your next appointment for a  physical exam in a year

## 2016-03-02 NOTE — Progress Notes (Signed)
Pre visit review using our clinic review tool, if applicable. No additional management support is needed unless otherwise documented below in the visit note. 

## 2016-03-02 NOTE — Assessment & Plan Note (Signed)
Td 2017 Get flu shots at work Labs: CMP, FLP, A1c, UA, urine culture Diet and exercise discussed

## 2016-03-03 DIAGNOSIS — Z09 Encounter for follow-up examination after completed treatment for conditions other than malignant neoplasm: Secondary | ICD-10-CM | POA: Insufficient documentation

## 2016-03-03 NOTE — Assessment & Plan Note (Signed)
H/o increase LFTs: Labs today Mild LUTS: DRE within normal, check a UA and urine culture, call if symptoms increase. RTC one year

## 2016-03-04 LAB — URINE CULTURE

## 2017-12-16 ENCOUNTER — Encounter: Payer: Self-pay | Admitting: Internal Medicine

## 2017-12-16 ENCOUNTER — Ambulatory Visit (INDEPENDENT_AMBULATORY_CARE_PROVIDER_SITE_OTHER): Payer: BLUE CROSS/BLUE SHIELD | Admitting: Internal Medicine

## 2017-12-16 VITALS — BP 118/76 | HR 81 | Temp 98.1°F | Resp 16 | Ht 70.0 in | Wt 156.1 lb

## 2017-12-16 DIAGNOSIS — Z114 Encounter for screening for human immunodeficiency virus [HIV]: Secondary | ICD-10-CM | POA: Diagnosis not present

## 2017-12-16 DIAGNOSIS — Z0000 Encounter for general adult medical examination without abnormal findings: Secondary | ICD-10-CM | POA: Diagnosis not present

## 2017-12-16 NOTE — Progress Notes (Signed)
Subjective:    Patient ID: Patrick PortsKarthik Shrake, male    DOB: 1976/02/26, 42 y.o.   MRN: [id number]  DOS:  12/16/2017 Type of visit - description : cpx Interval history: No major concerns, feeling well.  Wt Readings from Last 3 Encounters:  12/16/17 156 lb 2 oz (70.8 kg)  03/02/16 165 lb (74.8 kg)  09/02/14 161 lb 8 oz (73.3 kg)    Review of Systems Occasionally when he is under stress he tends to sweat, denies fever, chills, weight loss. Used to be able to sleep 7 to 8 hours and now he has some more difficulty staying asleep for more than 7 hours. He has some stress but no anxiety or depression per se. He does feel rested when he wakes up  Other than above, a 14 point review of systems is negative     Past Medical History:  Diagnosis Date  . H/O abnormal weight loss     Past Surgical History:  Procedure Laterality Date  . NO PAST SURGERIES      Social History   Socioeconomic History  . Marital status: Married    Spouse name: Not on file  . Number of children: 2  . Years of education: Not on file  . Highest education level: Not on file  Occupational History  . Occupation:   Art gallery managerengineer  Social Needs  . Financial resource strain: Not on file  . Food insecurity:    Worry: Not on file    Inability: Not on file  . Transportation needs:    Medical: Not on file    Non-medical: Not on file  Tobacco Use  . Smoking status: Never Smoker  . Smokeless tobacco: Never Used  Substance and Sexual Activity  . Alcohol use: Yes    Alcohol/week: 0.0 oz    Comment: occasionally   . Drug use: No  . Sexual activity: Not on file  Lifestyle  . Physical activity:    Days per week: Not on file    Minutes per session: Not on file  . Stress: Not on file  Relationships  . Social connections:    Talks on phone: Not on file    Gets together: Not on file    Attends religious service: Not on file    Active member of club or organization: Not on file    Attends meetings of clubs or  organizations: Not on file    Relationship status: Not on file  . Intimate partner violence:    Fear of current or ex partner: Not on file    Emotionally abused: Not on file    Physically abused: Not on file    Forced sexual activity: Not on file  Other Topics Concern  . Not on file  Social History Narrative   [place]   Children 2010 and 2006     Family History  Problem Relation Age of Onset  . Diabetes Neg Hx   . CAD Neg Hx   . Stroke Neg Hx   . Colon cancer Neg Hx   . Prostate cancer Neg Hx      Allergies as of 12/16/2017   No Known Allergies     Medication List    as of 12/16/2017 11:59 PM   You have not been prescribed any medications.        Objective:   Physical Exam BP 118/76 (BP Location: Left Arm, Patient Position: Sitting, Cuff Size: Small)   Pulse 81   Temp  98.1 F (36.7 C) (Oral)   Resp 16   Ht 5\' 10"  (1.778 m)   Wt 156 lb 2 oz (70.8 kg)   SpO2 98%   BMI 22.40 kg/m   General: Well developed, NAD, see BMI.  Neck: No  thyromegaly  HEENT:  Normocephalic . Face symmetric, atraumatic Lungs:  CTA B Normal respiratory effort, no intercostal retractions, no accessory muscle use. Heart: RRR,  no murmur.  No pretibial edema bilaterally  Abdomen:  Not distended, soft, non-tender. No rebound or rigidity.   Skin: Exposed areas without rash. Not pale. Not jaundice Neurologic:  alert & oriented X3.  Speech normal, gait appropriate for age and unassisted Strength symmetric and appropriate for age.  Psych: Cognition and judgment appear intact.  Cooperative with normal attention span and concentration.  Behavior appropriate. No anxious or depressed appearing.     Assessment & Plan:  Assessment Increase LFTs 2016, hep B and C negative  Plan: For CPX, doing great Unable to sleep as much as before,   Reviewed good sleep habits discussed.  See AVS; he can also use OTC melatonin.  Call if insomnia becomes a major issue.    RTC 1  year

## 2017-12-16 NOTE — Progress Notes (Signed)
Pre visit review using our clinic review tool, if applicable. No additional management support is needed unless otherwise documented below in the visit note. 

## 2017-12-16 NOTE — Patient Instructions (Signed)
  GO TO THE FRONT DESK Schedule labs to be done next week, fasting  Schedule your next appointment for a physical exam 1 year     HEALTHY SLEEP Sleep hygiene: Basic rules for a good night's sleep  Sleep only as much as you need to feel rested and then get out of bed  Keep a regular sleep schedule  Avoid forcing sleep  Exercise regularly for at least 20 minutes, preferably 4 to 5 hours before bedtime  Avoid caffeinated beverages after lunch  Avoid alcohol near bedtime: no "night cap"  Avoid smoking, especially in the evening  Do not go to bed hungry  Adjust bedroom environment  Avoid prolonged use of light-emitting screens before bedtime   Deal with your worries before bedtime

## 2017-12-16 NOTE — Assessment & Plan Note (Addendum)
Td 2017 Labs: Come back fasting for a CMP, FLP, CBC, TSH Diet and exercise: He is doing great, he remains active, he has lost some weight but he thinks is because he is watching his diet closely ; pt  feels very comfortable with a BMI of 22. EKG: NSR, no previous EKG, no acute changes

## 2017-12-17 NOTE — Assessment & Plan Note (Signed)
For CPX, doing great Unable to sleep as much as before,   Reviewed good sleep habits discussed.  See AVS; he can also use OTC melatonin.  Call if insomnia becomes a major issue.    RTC 1 year

## 2017-12-20 ENCOUNTER — Other Ambulatory Visit (INDEPENDENT_AMBULATORY_CARE_PROVIDER_SITE_OTHER): Payer: BLUE CROSS/BLUE SHIELD

## 2017-12-20 DIAGNOSIS — Z114 Encounter for screening for human immunodeficiency virus [HIV]: Secondary | ICD-10-CM | POA: Diagnosis not present

## 2017-12-20 DIAGNOSIS — Z0000 Encounter for general adult medical examination without abnormal findings: Secondary | ICD-10-CM | POA: Diagnosis not present

## 2017-12-20 LAB — CBC WITH DIFFERENTIAL/PLATELET
Basophils Absolute: 0 10*3/uL (ref 0.0–0.1)
Basophils Relative: 0.7 % (ref 0.0–3.0)
EOS PCT: 4.5 % (ref 0.0–5.0)
Eosinophils Absolute: 0.2 10*3/uL (ref 0.0–0.7)
HCT: 43.6 % (ref 39.0–52.0)
Hemoglobin: 15 g/dL (ref 13.0–17.0)
LYMPHS ABS: 1.9 10*3/uL (ref 0.7–4.0)
Lymphocytes Relative: 38.9 % (ref 12.0–46.0)
MCHC: 34.4 g/dL (ref 30.0–36.0)
MCV: 84.7 fl (ref 78.0–100.0)
MONO ABS: 0.4 10*3/uL (ref 0.1–1.0)
Monocytes Relative: 8.8 % (ref 3.0–12.0)
NEUTROS ABS: 2.2 10*3/uL (ref 1.4–7.7)
NEUTROS PCT: 47.1 % (ref 43.0–77.0)
PLATELETS: 240 10*3/uL (ref 150.0–400.0)
RBC: 5.15 Mil/uL (ref 4.22–5.81)
RDW: 12.7 % (ref 11.5–15.5)
WBC: 4.8 10*3/uL (ref 4.0–10.5)

## 2017-12-20 LAB — LIPID PANEL
Cholesterol: 207 mg/dL — ABNORMAL HIGH (ref 0–200)
HDL: 48.8 mg/dL (ref >39.00)
LDL Cholesterol: 128 mg/dL — ABNORMAL HIGH (ref 0–99)
NONHDL: 158.2
Total CHOL/HDL Ratio: 4
Triglycerides: 149 mg/dL (ref 0.0–149.0)
VLDL: 29.8 mg/dL (ref 0.0–40.0)

## 2017-12-20 LAB — COMPREHENSIVE METABOLIC PANEL
ALT: 12 U/L (ref 0–53)
AST: 14 U/L (ref 0–37)
Albumin: 4.4 g/dL (ref 3.5–5.2)
Alkaline Phosphatase: 38 U/L — ABNORMAL LOW (ref 39–117)
BILIRUBIN TOTAL: 0.7 mg/dL (ref 0.2–1.2)
BUN: 15 mg/dL (ref 6–23)
CO2: 31 mEq/L (ref 19–32)
CREATININE: 1.01 mg/dL (ref 0.40–1.50)
Calcium: 9.4 mg/dL (ref 8.4–10.5)
Chloride: 101 mEq/L (ref 96–112)
GFR: 86.12 mL/min (ref >60.00)
GLUCOSE: 101 mg/dL — AB (ref 70–99)
Potassium: 4.3 mEq/L (ref 3.5–5.1)
SODIUM: 138 meq/L (ref 135–145)
TOTAL PROTEIN: 7 g/dL (ref 6.0–8.3)

## 2017-12-20 LAB — TSH: TSH: 1.46 u[IU]/mL (ref 0.35–4.50)

## 2017-12-21 LAB — HIV ANTIBODY (ROUTINE TESTING W REFLEX): HIV: NONREACTIVE

## 2018-03-14 ENCOUNTER — Telehealth: Payer: Self-pay | Admitting: Internal Medicine

## 2018-03-14 NOTE — Telephone Encounter (Signed)
Pt dropped off health form that needs to be filled out. Put in Dr. Leta JunglingPaz's bin in the front office. Pt asked if you will fax the form and then call her and she will pick up a copy of the form.

## 2018-03-15 NOTE — Telephone Encounter (Signed)
Completed as much as possible on Health Screening Form; forwarded to provider with note that patient needs labs: Nicotene screening and A1C to complete form/SLS  09/25

## 2018-03-17 ENCOUNTER — Telehealth: Payer: Self-pay | Admitting: *Deleted

## 2018-03-17 DIAGNOSIS — Z139 Encounter for screening, unspecified: Secondary | ICD-10-CM

## 2018-03-17 DIAGNOSIS — Z131 Encounter for screening for diabetes mellitus: Secondary | ICD-10-CM

## 2018-03-17 NOTE — Telephone Encounter (Signed)
Telephone   03/14/2018 [facility]    [place], Patrick Rod, MD  Internal Medicine   Conversation  (Newest Message First)  March 15, 2018  Me A   8:30 AM  Note    Completed as much as possible on Health Screening Form; forwarded to provider with note that patient needs labs: Nicotene screening and A1C to complete form/SLS  09/25    March 14, 2018    12:11 PM  Patrick Berry routed this conversation to Me  [name], Patrick Berry    12:01 PM  Note    Pt dropped off health form that needs to be filled out. Put in Dr. Leta Jungling bin in the front office. Pt asked if you will fax the form and then call her and she will pick up a copy of the form.

## 2018-03-17 NOTE — Telephone Encounter (Signed)
Received the paperwork back on 03/16/18; still needed lab orders and call to patient. LMOM with contact name and number for return call RE: scheduling lab only appointment per his company guidelines. Lab orders placed in EMR/SLS 09/27

## 2018-03-31 ENCOUNTER — Other Ambulatory Visit (INDEPENDENT_AMBULATORY_CARE_PROVIDER_SITE_OTHER): Payer: BLUE CROSS/BLUE SHIELD

## 2018-03-31 ENCOUNTER — Ambulatory Visit: Payer: BLUE CROSS/BLUE SHIELD | Admitting: Internal Medicine

## 2018-03-31 DIAGNOSIS — Z23 Encounter for immunization: Secondary | ICD-10-CM

## 2018-03-31 DIAGNOSIS — Z139 Encounter for screening, unspecified: Secondary | ICD-10-CM | POA: Diagnosis not present

## 2018-03-31 DIAGNOSIS — Z131 Encounter for screening for diabetes mellitus: Secondary | ICD-10-CM | POA: Diagnosis not present

## 2018-03-31 LAB — HEMOGLOBIN A1C: Hgb A1c MFr Bld: 5.4 % (ref 4.6–6.5)

## 2018-03-31 NOTE — Telephone Encounter (Signed)
Spoke with patient regarding needed labs to complete Health Screening Form; scheduled for lab 01:15pm today/SLS 10/11

## 2018-04-04 LAB — NICOTINE AND COTININE LC/MS/MS, U
COTININE, URINE: <2 ng/mL
NICOTINE, URINE: <2 ng/mL

## 2018-04-06 NOTE — Telephone Encounter (Signed)
All information resulted; Health Screening Form faxed with conformation/SLS

## 2018-04-07 NOTE — Telephone Encounter (Signed)
Completed form faxed with confirmation; mailed copy to patient/SLS 10/17

## 2019-01-23 ENCOUNTER — Encounter: Payer: Self-pay | Admitting: Internal Medicine

## 2019-05-01 ENCOUNTER — Encounter: Payer: BLUE CROSS/BLUE SHIELD | Admitting: Internal Medicine

## 2019-05-29 ENCOUNTER — Encounter: Payer: BLUE CROSS/BLUE SHIELD | Admitting: Internal Medicine

## 2019-05-31 ENCOUNTER — Other Ambulatory Visit: Payer: Self-pay

## 2019-06-01 ENCOUNTER — Encounter: Payer: Self-pay | Admitting: Internal Medicine

## 2019-06-01 ENCOUNTER — Ambulatory Visit (INDEPENDENT_AMBULATORY_CARE_PROVIDER_SITE_OTHER): Payer: BC Managed Care – PPO | Admitting: Internal Medicine

## 2019-06-01 ENCOUNTER — Encounter: Payer: BC Managed Care – PPO | Admitting: Internal Medicine

## 2019-06-01 VITALS — BP 114/73 | HR 63 | Temp 96.9°F | Resp 16 | Ht 70.0 in | Wt 152.0 lb

## 2019-06-01 DIAGNOSIS — Z0000 Encounter for general adult medical examination without abnormal findings: Secondary | ICD-10-CM

## 2019-06-01 NOTE — Progress Notes (Signed)
Pre visit review using our clinic review tool, if applicable. No additional management support is needed unless otherwise documented below in the visit note. 

## 2019-06-01 NOTE — Patient Instructions (Signed)
GO TO THE LAB : Get the blood work     GO TO THE FRONT DESK Schedule your next appointment   for a physical exam in 1 year 

## 2019-06-01 NOTE — Assessment & Plan Note (Addendum)
Td 2017 Had a flu shot  Labs: FLP, CBC, CMP, A1c Diet and exercise: He is doing great

## 2019-06-01 NOTE — Progress Notes (Signed)
Subjective:    Patient ID: Patrick Berry, male    DOB: 1976-01-04, 43 y.o.   MRN: [id number]  DOS:  06/01/2019 Type of visit - description: cpx Other than occasional insomnia: He is doing well.   Wt Readings from Last 3 Encounters:  06/01/19 152 lb (68.9 kg)  12/16/17 156 lb 2 oz (70.8 kg)  03/02/16 165 lb (74.8 kg)     Review of Systems  Other than above, a 14 point review of systems is negative    Past Medical History:  Diagnosis Date  . H/O abnormal weight loss     Past Surgical History:  Procedure Laterality Date  . NO PAST SURGERIES      Social History   Socioeconomic History  . Marital status: Married    Spouse name: Not on file  . Number of children: 2  . Years of education: Not on file  . Highest education level: Not on file  Occupational History  . Occupation:   Art gallery manager  Tobacco Use  . Smoking status: Never Smoker  . Smokeless tobacco: Never Used  Substance and Sexual Activity  . Alcohol use: Yes    Alcohol/week: 0.0 standard drinks    Comment: occasionally   . Drug use: No  . Sexual activity: Not on file  Other Topics Concern  . Not on file  Social History Narrative   [place] from [place]   Children 2010 and 2006   Social Determinants of Health   Financial Resource Strain:   . Difficulty of Paying Living Expenses: Not on file  Food Insecurity:   . Worried About Programme researcher, broadcasting/film/video in the Last Year: Not on file  . Ran Out of Food in the Last Year: Not on file  Transportation Needs:   . Lack of Transportation (Medical): Not on file  . Lack of Transportation (Non-Medical): Not on file  Physical Activity:   . Days of Exercise per Week: Not on file  . Minutes of Exercise per Session: Not on file  Stress:   . Feeling of Stress : Not on file  Social Connections:   . Frequency of Communication with Friends and Family: Not on file  . Frequency of Social Gatherings with Friends and Family: Not on file  . Attends Religious Services:  Not on file  . Active Member of Clubs or Organizations: Not on file  . Attends Banker Meetings: Not on file  . Marital Status: Not on file  Intimate Partner Violence:   . Fear of Current or Ex-Partner: Not on file  . Emotionally Abused: Not on file  . Physically Abused: Not on file  . Sexually Abused: Not on file     Family History  Problem Relation Age of Onset  . Diabetes Neg Hx   . CAD Neg Hx   . Stroke Neg Hx   . Colon cancer Neg Hx   . Prostate cancer Neg Hx      Allergies as of 06/01/2019   No Known Allergies     Medication List    as of June 01, 2019 11:59 PM   You have not been prescribed any medications.         Objective:   Physical Exam BP 114/73 (BP Location: Left Arm, Patient Position: Sitting, Cuff Size: Small)   Pulse 63   Temp (!) 96.9 F (36.1 C) (Temporal)   Resp 16   Ht 5\' 10"  (1.778 m)   Wt 152 lb (68.9 kg)  SpO2 100%   BMI 21.81 kg/m  General: Well developed, NAD, BMI noted Neck: No  thyromegaly  HEENT:  Normocephalic . Face symmetric, atraumatic Lungs:  CTA B Normal respiratory effort, no intercostal retractions, no accessory muscle use. Heart: RRR,  no murmur.  No pretibial edema bilaterally  Abdomen:  Not distended, soft, non-tender. No rebound or rigidity.   Skin: Exposed areas without rash. Not pale. Not jaundice Neurologic:  alert & oriented X3.  Speech normal, gait appropriate for age and unassisted Strength symmetric and appropriate for age.  Psych: Cognition and judgment appear intact.  Cooperative with normal attention span and concentration.  Behavior appropriate. No anxious or depressed appearing.     Assessment    Assessment Increase LFTs 2016, hep B and C negative  Plan: Here for  CPX, doing great Occasional insomnia, not a major issue. RTC 1 year    This visit occurred during the SARS-CoV-2 public health emergency.  Safety protocols were in place, including screening questions prior  to the visit, additional usage of staff PPE, and extensive cleaning of exam room while observing appropriate contact time as indicated for disinfecting solutions.

## 2019-06-02 LAB — CBC WITH DIFFERENTIAL/PLATELET
Absolute Monocytes: 531 cells/uL (ref 200–950)
Basophils Absolute: 51 cells/uL (ref 0–200)
Basophils Relative: 0.8 %
Eosinophils Absolute: 166 cells/uL (ref 15–500)
Eosinophils Relative: 2.6 %
HCT: 41.9 % (ref 38.5–50.0)
Hemoglobin: 14.2 g/dL (ref 13.2–17.1)
Lymphs Abs: 2323 cells/uL (ref 850–3900)
MCH: 28.6 pg (ref 27.0–33.0)
MCHC: 33.9 g/dL (ref 32.0–36.0)
MCV: 84.5 fL (ref 80.0–100.0)
MPV: 9.6 fL (ref 7.5–12.5)
Monocytes Relative: 8.3 %
Neutro Abs: 3328 cells/uL (ref 1500–7800)
Neutrophils Relative %: 52 %
Platelets: 248 10*3/uL (ref 140–400)
RBC: 4.96 10*6/uL (ref 4.20–5.80)
RDW: 12.4 % (ref 11.0–15.0)
Total Lymphocyte: 36.3 %
WBC: 6.4 10*3/uL (ref 3.8–10.8)

## 2019-06-02 LAB — COMPREHENSIVE METABOLIC PANEL
AG Ratio: 1.8 (calc) (ref 1.0–2.5)
ALT: 13 U/L (ref 9–46)
AST: 17 U/L (ref 10–40)
Albumin: 4.6 g/dL (ref 3.6–5.1)
Alkaline phosphatase (APISO): 39 U/L (ref 36–130)
BUN: 11 mg/dL (ref 7–25)
CO2: 27 mmol/L (ref 20–32)
Calcium: 9.7 mg/dL (ref 8.6–10.3)
Chloride: 100 mmol/L (ref 98–110)
Creat: 0.92 mg/dL (ref 0.60–1.35)
Globulin: 2.5 g/dL (calc) (ref 1.9–3.7)
Glucose, Bld: 89 mg/dL (ref 65–99)
Potassium: 4.5 mmol/L (ref 3.5–5.3)
Sodium: 136 mmol/L (ref 135–146)
Total Bilirubin: 0.7 mg/dL (ref 0.2–1.2)
Total Protein: 7.1 g/dL (ref 6.1–8.1)

## 2019-06-02 LAB — LIPID PANEL
Cholesterol: 199 mg/dL (ref <200)
HDL: 50 mg/dL (ref >=40)
LDL Cholesterol (Calc): 129 mg/dL (calc) — ABNORMAL HIGH
Non-HDL Cholesterol (Calc): 149 mg/dL (calc) — ABNORMAL HIGH (ref <130)
Total CHOL/HDL Ratio: 4 (calc) (ref <5.0)
Triglycerides: 102 mg/dL (ref <150)

## 2019-06-02 LAB — HEMOGLOBIN A1C
Hgb A1c MFr Bld: 5.2 % of total Hgb (ref <5.7)
Mean Plasma Glucose: 103 (calc)
eAG (mmol/L): 5.7 (calc)

## 2019-06-03 NOTE — Assessment & Plan Note (Signed)
Here for  CPX, doing great Occasional insomnia, not a major issue. RTC 1 year

## 2019-06-04 ENCOUNTER — Telehealth: Payer: Self-pay

## 2019-06-04 NOTE — Telephone Encounter (Signed)
Physical form completed and faxed to [facility] at [phone]. Form sent for scanning. Received fax confirmation.

## 2020-06-30 ENCOUNTER — Other Ambulatory Visit: Payer: Self-pay

## 2020-06-30 ENCOUNTER — Encounter: Payer: Self-pay | Admitting: Internal Medicine

## 2020-06-30 ENCOUNTER — Ambulatory Visit (INDEPENDENT_AMBULATORY_CARE_PROVIDER_SITE_OTHER): Payer: BC Managed Care – PPO | Admitting: Internal Medicine

## 2020-06-30 VITALS — BP 136/87 | HR 73 | Temp 98.4°F | Resp 16 | Ht 70.0 in | Wt 160.1 lb

## 2020-06-30 DIAGNOSIS — R399 Unspecified symptoms and signs involving the genitourinary system: Secondary | ICD-10-CM

## 2020-06-30 DIAGNOSIS — Z1159 Encounter for screening for other viral diseases: Secondary | ICD-10-CM

## 2020-06-30 DIAGNOSIS — Z0000 Encounter for general adult medical examination without abnormal findings: Secondary | ICD-10-CM | POA: Diagnosis not present

## 2020-06-30 NOTE — Progress Notes (Signed)
Pre visit review using our clinic review tool, if applicable. No additional management support is needed unless otherwise documented below in the visit note. 

## 2020-06-30 NOTE — Progress Notes (Signed)
Subjective:    Patient ID: Patrick Berry, male    DOB: [birth year], 45 y.o.   MRN: [id number]  DOS:  06/30/2020 Type of visit - description: CPX In general feels well. + Stress but is managing well. Rarely has some urgency going to urinate but denies dysuria, gross hematuria or difficulty urinating    Wt Readings from Last 3 Encounters:  06/30/20 160 lb 2 oz (72.6 kg)  06/01/19 152 lb (68.9 kg)  12/16/17 156 lb 2 oz (70.8 kg)     Review of Systems  Other than above, a 14 point review of systems is negative      Past Medical History:  Diagnosis Date   H/O abnormal weight loss     Past Surgical History:  Procedure Laterality Date   NO PAST SURGERIES     Social History   Socioeconomic History   Marital status: Married    Spouse name: Not on file   Number of children: 2   Years of education: Not on file   Highest education level: Not on file  Occupational History   Occupation:   Art gallery manager, [employer]   Tobacco Use   Smoking status: Never Smoker   Smokeless tobacco: Never Used  Substance and Sexual Activity   Alcohol use: Yes    Alcohol/week: 0.0 standard drinks    Comment: occasionally    Drug use: No   Sexual activity: Not on file  Other Topics Concern   Not on file  Social History Narrative   [place] from [place]   Children 2010 and 2006   Social Determinants of Health   Financial Resource Strain: Not on file  Food Insecurity: Not on file  Transportation Needs: Not on file  Physical Activity: Not on file  Stress: Not on file  Social Connections: Not on file  Intimate Partner Violence: Not on file    Allergies as of 06/30/2020   No Known Allergies     Medication List    as of June 30, 2020 11:59 PM   You have not been prescribed any medications.        Objective:   Physical Exam BP 136/87 (BP Location: Right Arm, Patient Position: Sitting, Cuff Size: Small)    Pulse 73    Temp 98.4 F (36.9 C) (Oral)    Resp  16    Ht 5\' 10"  (1.778 m)    Wt 160 lb 2 oz (72.6 kg)    SpO2 100%    BMI 22.98 kg/m  General: Well developed, NAD, BMI noted Neck: No  thyromegaly  HEENT:  Normocephalic . Face symmetric, atraumatic Lungs:  CTA B Normal respiratory effort, no intercostal retractions, no accessory muscle use. Heart: RRR,  no murmur.  Abdomen:  Not distended, soft, non-tender. No rebound or rigidity.   Lower extremities: no pretibial edema bilaterally  Skin: Exposed areas without rash. Not pale. Not jaundice Neurologic:  alert & oriented X3.  Speech normal, gait appropriate for age and unassisted Strength symmetric and appropriate for age.  Psych: Cognition and judgment appear intact.  Cooperative with normal attention span and concentration.  Behavior appropriate. No anxious or depressed appearing.     Assessment      Assessment Increase LFTs 2016, hep B and C negative  Plan: Here for  CPX Doing well, did report episodic urinary urgency without order LUTS.  Check a UA RTC 1 year.   This visit occurred during the SARS-CoV-2 public health emergency.  Safety protocols were in  place, including screening questions prior to the visit, additional usage of staff PPE, and extensive cleaning of exam room while observing appropriate contact time as indicated for disinfecting solutions.

## 2020-06-30 NOTE — Patient Instructions (Signed)
   GO TO THE LAB : Get the blood work     GO TO THE FRONT DESK, PLEASE SCHEDULE YOUR APPOINTMENTS Come back for a physical exam in 1 year 

## 2020-07-01 ENCOUNTER — Encounter: Payer: Self-pay | Admitting: Internal Medicine

## 2020-07-01 ENCOUNTER — Other Ambulatory Visit (INDEPENDENT_AMBULATORY_CARE_PROVIDER_SITE_OTHER): Payer: BC Managed Care – PPO

## 2020-07-01 ENCOUNTER — Telehealth: Payer: Self-pay

## 2020-07-01 DIAGNOSIS — Z0000 Encounter for general adult medical examination without abnormal findings: Secondary | ICD-10-CM

## 2020-07-01 LAB — CBC WITH DIFFERENTIAL/PLATELET
Basophils Absolute: 0 10*3/uL (ref 0.0–0.1)
Basophils Relative: 0.8 % (ref 0.0–3.0)
Eosinophils Absolute: 0.2 10*3/uL (ref 0.0–0.7)
Eosinophils Relative: 3.7 % (ref 0.0–5.0)
HCT: 41.6 % (ref 39.0–52.0)
Hemoglobin: 14.2 g/dL (ref 13.0–17.0)
Lymphocytes Relative: 37.2 % (ref 12.0–46.0)
Lymphs Abs: 2 10*3/uL (ref 0.7–4.0)
MCHC: 34.1 g/dL (ref 30.0–36.0)
MCV: 83.7 fl (ref 78.0–100.0)
Monocytes Absolute: 0.4 10*3/uL (ref 0.1–1.0)
Monocytes Relative: 8.3 % (ref 3.0–12.0)
Neutro Abs: 2.7 10*3/uL (ref 1.4–7.7)
Neutrophils Relative %: 50 % (ref 43.0–77.0)
Platelets: 274 10*3/uL (ref 150.0–400.0)
RBC: 4.98 Mil/uL (ref 4.22–5.81)
RDW: 13.1 % (ref 11.5–15.5)
WBC: 5.4 10*3/uL (ref 4.0–10.5)

## 2020-07-01 LAB — COMPREHENSIVE METABOLIC PANEL
ALT: 15 U/L (ref 0–53)
AST: 15 U/L (ref 0–37)
Albumin: 4.7 g/dL (ref 3.5–5.2)
Alkaline Phosphatase: 42 U/L (ref 39–117)
BUN: 12 mg/dL (ref 6–23)
CO2: 30 mEq/L (ref 19–32)
Calcium: 10 mg/dL (ref 8.4–10.5)
Chloride: 102 mEq/L (ref 96–112)
Creatinine, Ser: 0.91 mg/dL (ref 0.40–1.50)
GFR: 102.52 mL/min (ref >60.00)
Glucose, Bld: 92 mg/dL (ref 70–99)
Potassium: 3.8 mEq/L (ref 3.5–5.1)
Sodium: 137 mEq/L (ref 135–145)
Total Bilirubin: 0.4 mg/dL (ref 0.2–1.2)
Total Protein: 7.3 g/dL (ref 6.0–8.3)

## 2020-07-01 LAB — URINALYSIS, ROUTINE W REFLEX MICROSCOPIC
Bilirubin Urine: NEGATIVE
Hgb urine dipstick: NEGATIVE
Ketones, ur: NEGATIVE
Leukocytes,Ua: NEGATIVE
Nitrite: NEGATIVE
RBC / HPF: NONE SEEN (ref 0–?)
Specific Gravity, Urine: 1.01 (ref 1.000–1.030)
Total Protein, Urine: NEGATIVE
Urine Glucose: NEGATIVE
Urobilinogen, UA: 0.2 (ref 0.0–1.0)
WBC, UA: NONE SEEN (ref 0–?)
pH: 7 (ref 5.0–8.0)

## 2020-07-01 LAB — LIPID PANEL
Cholesterol: 187 mg/dL (ref 0–200)
HDL: 46.9 mg/dL (ref >39.00)
LDL Cholesterol: 101 mg/dL — ABNORMAL HIGH (ref 0–99)
NonHDL: 140.42
Total CHOL/HDL Ratio: 4
Triglycerides: 196 mg/dL — ABNORMAL HIGH (ref 0.0–149.0)
VLDL: 39.2 mg/dL (ref 0.0–40.0)

## 2020-07-01 LAB — TSH: TSH: 0.74 u[IU]/mL (ref 0.35–4.50)

## 2020-07-01 LAB — HEPATITIS C ANTIBODY
Hepatitis C Ab: NONREACTIVE
SIGNAL TO CUT-OFF: 0.01 (ref <1.00)

## 2020-07-01 LAB — HEMOGLOBIN A1C: Hgb A1c MFr Bld: 5.4 % (ref 4.6–6.5)

## 2020-07-01 NOTE — Assessment & Plan Note (Signed)
Here for  CPX Doing well, did report episodic urinary urgency without order LUTS.  Check a UA RTC 1 year.

## 2020-07-01 NOTE — Telephone Encounter (Signed)
Physical form completed and faxed back to [facility] at [phone]. Form sent for scanning.

## 2020-07-01 NOTE — Assessment & Plan Note (Signed)
  Td 2017 COVID VAX: x3  Had a flu shot  Labs: CMP, lipid panel (nonfasting), CBC TSH hep C, UA Diet and exercise: Doing well, mostly yoga, does watch his diet, remind him to do some cardio. Will bring form for work RTC 1 year

## 2020-07-01 NOTE — Telephone Encounter (Signed)
Received fax confirmation

## 2020-07-08 DIAGNOSIS — Z1152 Encounter for screening for COVID-19: Secondary | ICD-10-CM | POA: Diagnosis not present

## 2020-09-11 ENCOUNTER — Encounter: Payer: Self-pay | Admitting: Internal Medicine

## 2021-07-02 ENCOUNTER — Encounter: Payer: Self-pay | Admitting: Internal Medicine

## 2021-07-02 ENCOUNTER — Ambulatory Visit (INDEPENDENT_AMBULATORY_CARE_PROVIDER_SITE_OTHER): Payer: BC Managed Care – PPO | Admitting: Internal Medicine

## 2021-07-02 VITALS — BP 116/82 | HR 73 | Temp 98.4°F | Resp 16 | Ht 70.0 in | Wt 164.1 lb

## 2021-07-02 DIAGNOSIS — R399 Unspecified symptoms and signs involving the genitourinary system: Secondary | ICD-10-CM

## 2021-07-02 DIAGNOSIS — Z8249 Family history of ischemic heart disease and other diseases of the circulatory system: Secondary | ICD-10-CM | POA: Diagnosis not present

## 2021-07-02 DIAGNOSIS — Z0000 Encounter for general adult medical examination without abnormal findings: Secondary | ICD-10-CM | POA: Diagnosis not present

## 2021-07-02 LAB — URINALYSIS, ROUTINE W REFLEX MICROSCOPIC
Bilirubin Urine: NEGATIVE
Hgb urine dipstick: NEGATIVE
Ketones, ur: NEGATIVE
Leukocytes,Ua: NEGATIVE
Nitrite: NEGATIVE
RBC / HPF: NONE SEEN (ref 0–?)
Specific Gravity, Urine: <=1.005 — AB (ref 1.000–1.030)
Total Protein, Urine: NEGATIVE
Urine Glucose: NEGATIVE
Urobilinogen, UA: 0.2 (ref 0.0–1.0)
pH: 7 (ref 5.0–8.0)

## 2021-07-02 LAB — LIPID PANEL
Cholesterol: 229 mg/dL — ABNORMAL HIGH (ref 0–200)
HDL: 52 mg/dL (ref >39.00)
LDL Cholesterol: 144 mg/dL — ABNORMAL HIGH (ref 0–99)
NonHDL: 177.27
Total CHOL/HDL Ratio: 4
Triglycerides: 164 mg/dL — ABNORMAL HIGH (ref 0.0–149.0)
VLDL: 32.8 mg/dL (ref 0.0–40.0)

## 2021-07-02 LAB — HEMOGLOBIN A1C: Hgb A1c MFr Bld: 5.7 % (ref 4.6–6.5)

## 2021-07-02 LAB — CBC WITH DIFFERENTIAL/PLATELET
Basophils Absolute: 0 10*3/uL (ref 0.0–0.1)
Basophils Relative: 0.4 % (ref 0.0–3.0)
Eosinophils Absolute: 0.2 10*3/uL (ref 0.0–0.7)
Eosinophils Relative: 3.2 % (ref 0.0–5.0)
HCT: 44.4 % (ref 39.0–52.0)
Hemoglobin: 14.6 g/dL (ref 13.0–17.0)
Lymphocytes Relative: 44.8 % (ref 12.0–46.0)
Lymphs Abs: 2.2 10*3/uL (ref 0.7–4.0)
MCHC: 32.9 g/dL (ref 30.0–36.0)
MCV: 84.4 fl (ref 78.0–100.0)
Monocytes Absolute: 0.4 10*3/uL (ref 0.1–1.0)
Monocytes Relative: 9.1 % (ref 3.0–12.0)
Neutro Abs: 2.1 10*3/uL (ref 1.4–7.7)
Neutrophils Relative %: 42.5 % — ABNORMAL LOW (ref 43.0–77.0)
Platelets: 240 10*3/uL (ref 150.0–400.0)
RBC: 5.26 Mil/uL (ref 4.22–5.81)
RDW: 12.7 % (ref 11.5–15.5)
WBC: 4.9 10*3/uL (ref 4.0–10.5)

## 2021-07-02 LAB — COMPREHENSIVE METABOLIC PANEL
ALT: 23 U/L (ref 0–53)
AST: 19 U/L (ref 0–37)
Albumin: 4.7 g/dL (ref 3.5–5.2)
Alkaline Phosphatase: 44 U/L (ref 39–117)
BUN: 13 mg/dL (ref 6–23)
CO2: 30 mEq/L (ref 19–32)
Calcium: 9.9 mg/dL (ref 8.4–10.5)
Chloride: 102 mEq/L (ref 96–112)
Creatinine, Ser: 1.03 mg/dL (ref 0.40–1.50)
GFR: 87.74 mL/min (ref >60.00)
Glucose, Bld: 89 mg/dL (ref 70–99)
Potassium: 4.8 mEq/L (ref 3.5–5.1)
Sodium: 139 mEq/L (ref 135–145)
Total Bilirubin: 0.8 mg/dL (ref 0.2–1.2)
Total Protein: 7.4 g/dL (ref 6.0–8.3)

## 2021-07-02 LAB — PSA: PSA: 1.16 ng/mL (ref 0.10–4.00)

## 2021-07-02 MED ORDER — PAROXETINE HCL 10 MG PO TABS: 10.0000 mg | ORAL_TABLET | Freq: Every day | ORAL | 3 refills | Status: DC

## 2021-07-02 NOTE — Progress Notes (Signed)
Subjective:    Patient ID: Patrick Berry, male    DOB: 02-May-1976, 46 y.o.   MRN: [id number]  DOS:  07/02/2021 Type of visit - description: cpx  Since the last office visit is doing well. Did report few months history of premature ejaculation, he did see his own research online and self prescribe sildenafil with some relief. Denies any ED issues, denies dysuria/gross hematuria/testicular pain or swelling.  Review of Systems  Other than above, a 14 point review of systems is negative      Past Medical History:  Diagnosis Date   H/O abnormal weight loss     Past Surgical History:  Procedure Laterality Date   NO PAST SURGERIES     Social History   Socioeconomic History   Marital status: Married    Spouse name: Not on file   Number of children: 2   Years of education: Not on file   Highest education level: Not on file  Occupational History   Occupation: Chief Financial Officer, [employer]  Tobacco Use   Smoking status: Never   Smokeless tobacco: Never  Substance and Sexual Activity   Alcohol use: Yes    Alcohol/week: 0.0 standard drinks    Comment: occasionally    Drug use: No   Sexual activity: Not on file  Other Topics Concern   Not on file  Social History Narrative   [place] from [place]   Children 2010 and 2006   Social Determinants of Health   Financial Resource Strain: Not on file  Food Insecurity: Not on file  Transportation Needs: Not on file  Physical Activity: Not on file  Stress: Not on file  Social Connections: Not on file  Intimate Partner Violence: Not on file    Current Outpatient Medications  Medication Instructions   PARoxetine (PAXIL) 10 mg, Oral, Daily       Objective:   Physical Exam BP 116/82 (BP Location: Left Arm, Patient Position: Sitting, Cuff Size: Small)    Pulse 73    Temp 98.4 F (36.9 C) (Oral)    Resp 16    Ht 5\' 10"  (1.778 m)    Wt 164 lb 2 oz (74.4 kg)    SpO2 97%    BMI 23.55 kg/m  General: Well developed, NAD, BMI  noted Neck: No  thyromegaly  HEENT:  Normocephalic . Face symmetric, atraumatic Lungs:  CTA B Normal respiratory effort, no intercostal retractions, no accessory muscle use. Heart: RRR,  no murmur.  Abdomen:  Not distended, soft, non-tender. No rebound or rigidity.   Lower extremities: no pretibial edema bilaterally  Skin: Exposed areas without rash. Not pale. Not jaundice DRE: Normal sphincter tone, no stools, prostate with minimal enlargement but not nodular or tender to palpation GU: normal scrotal contents and penis  Neurologic:  alert & oriented X3.  Speech normal, gait appropriate for age and unassisted Strength symmetric and appropriate for age.  Psych: Cognition and judgment appear intact.  Cooperative with normal attention span and concentration.  Behavior appropriate. No anxious or depressed appearing.     Assessment      Assessment Increase LFTs 2016, hep B and C negative  PLAN Here for CPX Premature ejaculation: Reports a history of such for the last few months, self prescribed sildenafil and apparently helped.  Advised that most effective treatment will be on low-dose of an SSRI.  Rx Paxil 10 mg daily, if no help we could try sildenafil. Coronary calcium score: Patient request such test. - EKG today -  No ischemic changes, RSR V1 noted.  Otherwise no different from previous - I advised him that I am willing to order understanding that he is low risk, he could have incidental findings leading to further testing. RTC 1 year.    This visit occurred during the SARS-CoV-2 public health emergency.  Safety protocols were in place, including screening questions prior to the visit, additional usage of staff PPE, and extensive cleaning of exam room while observing appropriate contact time as indicated for disinfecting solutions.

## 2021-07-02 NOTE — Assessment & Plan Note (Signed)
Td 2017 COVID VAX: booster recommended Had a flu shot  Prostate cancer screening: I will proceed with genital exam due to premature ejaculation.  DRE shows slightly enlarged prostate, we are checking a PSA, UA urine culture. Colon cancer screening:will start next year. Labs: CMP, FLP, CBC, A1c, PSA, UA, urine culture Diet and exercise: He continues to do well

## 2021-07-02 NOTE — Assessment & Plan Note (Signed)
Here for CPX Premature ejaculation: Reports a history of such for the last few months, self prescribed sildenafil and apparently helped.  Advised that most effective treatment will be on low-dose of an SSRI.  Rx Paxil 10 mg daily, if no help we could try sildenafil. Coronary calcium score: Patient request such test. - EKG today - No ischemic changes, RSR V1 noted.  Otherwise no different from previous - I advised him that I am willing to order understanding that he is low risk, he could have incidental findings leading to further testing. RTC 1 year.

## 2021-07-02 NOTE — Patient Instructions (Signed)
For premature ejaculation: Start Paxil 10 mg daily. If that is not helping, please let me know.  They will call you to schedule a coronary calcium score   GO TO THE LAB : Get the blood work     [place], [place] Come back for a physical exam in 1 year

## 2021-07-03 LAB — URINE CULTURE
MICRO NUMBER:: 12862872
Result:: NO GROWTH
SPECIMEN QUALITY:: ADEQUATE

## 2021-07-22 NOTE — Progress Notes (Unsigned)
none

## 2021-08-19 ENCOUNTER — Encounter (HOSPITAL_COMMUNITY): Payer: Self-pay

## 2021-09-11 ENCOUNTER — Other Ambulatory Visit: Payer: Self-pay | Admitting: Internal Medicine

## 2021-10-01 ENCOUNTER — Encounter: Payer: Self-pay | Admitting: Internal Medicine

## 2022-02-12 ENCOUNTER — Encounter: Payer: Self-pay | Admitting: Internal Medicine

## 2022-07-05 ENCOUNTER — Ambulatory Visit (INDEPENDENT_AMBULATORY_CARE_PROVIDER_SITE_OTHER): Payer: Managed Care, Other (non HMO) | Admitting: Internal Medicine

## 2022-07-05 ENCOUNTER — Other Ambulatory Visit (INDEPENDENT_AMBULATORY_CARE_PROVIDER_SITE_OTHER): Payer: Managed Care, Other (non HMO)

## 2022-07-05 ENCOUNTER — Encounter: Payer: Self-pay | Admitting: Internal Medicine

## 2022-07-05 ENCOUNTER — Other Ambulatory Visit: Payer: Self-pay | Admitting: Internal Medicine

## 2022-07-05 VITALS — BP 126/84 | HR 69 | Temp 98.1°F | Resp 16 | Ht 70.0 in | Wt 168.4 lb

## 2022-07-05 DIAGNOSIS — Z0000 Encounter for general adult medical examination without abnormal findings: Secondary | ICD-10-CM

## 2022-07-05 DIAGNOSIS — Z8249 Family history of ischemic heart disease and other diseases of the circulatory system: Secondary | ICD-10-CM

## 2022-07-05 DIAGNOSIS — Z23 Encounter for immunization: Secondary | ICD-10-CM | POA: Diagnosis not present

## 2022-07-05 DIAGNOSIS — Z125 Encounter for screening for malignant neoplasm of prostate: Secondary | ICD-10-CM | POA: Diagnosis not present

## 2022-07-05 LAB — CBC WITH DIFFERENTIAL/PLATELET
Basophils Absolute: 0 10*3/uL (ref 0.0–0.1)
Basophils Relative: 0.6 % (ref 0.0–3.0)
Eosinophils Absolute: 0.3 10*3/uL (ref 0.0–0.7)
Eosinophils Relative: 6.2 % — ABNORMAL HIGH (ref 0.0–5.0)
HCT: 42 % (ref 39.0–52.0)
Hemoglobin: 14.3 g/dL (ref 13.0–17.0)
Lymphocytes Relative: 40.3 % (ref 12.0–46.0)
Lymphs Abs: 2 10*3/uL (ref 0.7–4.0)
MCHC: 34 g/dL (ref 30.0–36.0)
MCV: 84.5 fl (ref 78.0–100.0)
Monocytes Absolute: 0.4 10*3/uL (ref 0.1–1.0)
Monocytes Relative: 8.7 % (ref 3.0–12.0)
Neutro Abs: 2.2 10*3/uL (ref 1.4–7.7)
Neutrophils Relative %: 44.2 % (ref 43.0–77.0)
Platelets: 246 10*3/uL (ref 150.0–400.0)
RBC: 4.97 Mil/uL (ref 4.22–5.81)
RDW: 13.1 % (ref 11.5–15.5)
WBC: 4.9 10*3/uL (ref 4.0–10.5)

## 2022-07-05 LAB — COMPREHENSIVE METABOLIC PANEL
ALT: 17 U/L (ref 0–53)
AST: 15 U/L (ref 0–37)
Albumin: 4.3 g/dL (ref 3.5–5.2)
Alkaline Phosphatase: 44 U/L (ref 39–117)
BUN: 11 mg/dL (ref 6–23)
CO2: 29 mEq/L (ref 19–32)
Calcium: 9.3 mg/dL (ref 8.4–10.5)
Chloride: 105 mEq/L (ref 96–112)
Creatinine, Ser: 1 mg/dL (ref 0.40–1.50)
GFR: 90.26 mL/min (ref >60.00)
Glucose, Bld: 100 mg/dL — ABNORMAL HIGH (ref 70–99)
Potassium: 5 mEq/L (ref 3.5–5.1)
Sodium: 139 mEq/L (ref 135–145)
Total Bilirubin: 0.3 mg/dL (ref 0.2–1.2)
Total Protein: 6.8 g/dL (ref 6.0–8.3)

## 2022-07-05 LAB — LIPID PANEL
Cholesterol: 190 mg/dL (ref 0–200)
HDL: 49.9 mg/dL (ref >39.00)
LDL Cholesterol: 117 mg/dL — ABNORMAL HIGH (ref 0–99)
NonHDL: 139.6
Total CHOL/HDL Ratio: 4
Triglycerides: 111 mg/dL (ref 0.0–149.0)
VLDL: 22.2 mg/dL (ref 0.0–40.0)

## 2022-07-05 LAB — HEMOGLOBIN A1C: Hgb A1c MFr Bld: 5.8 % (ref 4.6–6.5)

## 2022-07-05 LAB — PSA: PSA: 1.19 ng/mL (ref 0.10–4.00)

## 2022-07-05 NOTE — Assessment & Plan Note (Signed)
Here for CPX Premature ejaculation: See LOV, took Paxil temporarily, then symptoms self resolved. Doing well RTC 1 year

## 2022-07-05 NOTE — Progress Notes (Signed)
   Subjective:    Patient ID: Patrick Berry, male    DOB: 08-08-1975, 47 y.o.   MRN: [id number]  DOS:  07/05/2022 Type of visit - description: cpx  Here for CPX Doing well. No concerns other than he likes to proceed with a calcium coronary score  Review of Systems  Other than above, a 14 point review of systems is negative    Past Medical History:  Diagnosis Date   H/O abnormal weight loss     Past Surgical History:  Procedure Laterality Date   NO PAST SURGERIES     Social History   Socioeconomic History   Marital status: Married    Spouse name: Not on file   Number of children: 2   Years of education: Not on file   Highest education level: Not on file  Occupational History   Occupation: Chief Financial Officer, [employer]  Tobacco Use   Smoking status: Never   Smokeless tobacco: Never  Substance and Sexual Activity   Alcohol use: Yes    Alcohol/week: 0.0 standard drinks of alcohol    Comment: occasionally    Drug use: No   Sexual activity: Not on file  Other Topics Concern   Not on file  Social History Narrative   [place] from [place]   Children 2010 and 2006   Social Determinants of Health   Financial Resource Strain: Not on file  Food Insecurity: Not on file  Transportation Needs: Not on file  Physical Activity: Not on file  Stress: Not on file  Social Connections: Not on file  Intimate Partner Violence: Not on file    No current outpatient medications      Objective:   Physical Exam BP 126/84   Pulse 69   Temp 98.1 F (36.7 C) (Oral)   Resp 16   Ht 5\' 10"  (1.778 m)   Wt 168 lb 6 oz (76.4 kg)   SpO2 98%   BMI 24.16 kg/m  General: Well developed, NAD, BMI noted Neck: No  thyromegaly  HEENT:  Normocephalic . Face symmetric, atraumatic Lungs:  CTA B Normal respiratory effort, no intercostal retractions, no accessory muscle use. Heart: RRR,  no murmur.  Abdomen:  Not distended, soft, non-tender. No rebound or rigidity.   Lower extremities: no  pretibial edema bilaterally  Skin: Exposed areas without rash. Not pale. Not jaundice Neurologic:  alert & oriented X3.  Speech normal, gait appropriate for age and unassisted Strength symmetric and appropriate for age.  Psych: Cognition and judgment appear intact.  Cooperative with normal attention span and concentration.  Behavior appropriate. No anxious or depressed appearing.     Assessment    Assessment Increase LFTs 2016, hep B and C negative  PLAN Here for CPX Premature ejaculation: See LOV, took Paxil temporarily, then symptoms self resolved. Doing well RTC 1 year

## 2022-07-05 NOTE — Patient Instructions (Signed)
Good to see you today  Will order a calcium coronary score  Return the stool test at your earliest convenience  GO TO THE LAB : Get the blood work     [place], [place] Come back for a physical exam in 1 year

## 2022-07-05 NOTE — Assessment & Plan Note (Signed)
Td 2017 COVID VAX: booster recommended flu shot today Prostate cancer screening: No symptoms, check PSA CCS: No FH, no symptoms, 3 options discussed, we agreed on IFOB for now. Labs: CMP FLP CBC A1c PSA Calcium coronary score: Patient requested test, has seen many friends dying from heart attack and he is concerned about his coronary health. Diet and exercise: He continues to do well

## 2022-07-06 LAB — FECAL OCCULT BLOOD, IMMUNOCHEMICAL: Fecal Occult Bld: NEGATIVE

## 2022-07-09 ENCOUNTER — Ambulatory Visit (HOSPITAL_BASED_OUTPATIENT_CLINIC_OR_DEPARTMENT_OTHER)
Admission: RE | Admit: 2022-07-09 | Discharge: 2022-07-09 | Disposition: A | Discharge status: Home / Self Care | Payer: Managed Care, Other (non HMO) | Source: Ambulatory Visit | Attending: Internal Medicine | Admitting: Internal Medicine

## 2022-07-09 DIAGNOSIS — Z8249 Family history of ischemic heart disease and other diseases of the circulatory system: Secondary | ICD-10-CM | POA: Insufficient documentation

## 2022-07-12 ENCOUNTER — Encounter: Payer: Self-pay | Admitting: Internal Medicine

## 2023-04-26 ENCOUNTER — Encounter: Payer: Self-pay | Admitting: Internal Medicine

## 2023-04-26 ENCOUNTER — Ambulatory Visit (INDEPENDENT_AMBULATORY_CARE_PROVIDER_SITE_OTHER): Payer: Managed Care, Other (non HMO) | Admitting: Internal Medicine

## 2023-04-26 VITALS — BP 116/82 | HR 67 | Temp 98.2°F | Resp 16 | Ht 70.0 in | Wt 171.2 lb

## 2023-04-26 DIAGNOSIS — K4090 Unilateral inguinal hernia, without obstruction or gangrene, not specified as recurrent: Secondary | ICD-10-CM

## 2023-04-26 DIAGNOSIS — Z23 Encounter for immunization: Secondary | ICD-10-CM | POA: Diagnosis not present

## 2023-04-26 NOTE — Assessment & Plan Note (Signed)
R abdominal wall hernia: Suspect hernia, explained to patient what it is, observation versus surgical referral. Elected surgical referral, will arrange. Red flag symptoms discussed with patient. Preventive care: Flu shot today

## 2023-04-26 NOTE — Patient Instructions (Signed)
Will refer you to general surgery here in [facility]

## 2023-04-26 NOTE — Progress Notes (Signed)
   Subjective:    Patient ID: Patrick Berry, male    DOB: 01/30/1976, 47 y.o.   MRN: [id number]  DOS:  04/26/2023 Type of visit - description: Acute  About 2 weeks ago noted a bulge on the right suprapubic area. It is on and off, when he touches the area is slightly TTP.  Denies nausea vomiting. No scrotal pain or swelling.   Review of Systems See above   Past Medical History:  Diagnosis Date   H/O abnormal weight loss     Past Surgical History:  Procedure Laterality Date   NO PAST SURGERIES      No current outpatient medications     Objective:   Physical Exam Abdominal:       Comments: Very subtle bulging with Valsalva.    BP 116/82   Pulse 67   Temp 98.2 F (36.8 C) (Oral)   Resp 16   Ht 5\' 10"  (1.778 m)   Wt 171 lb 4 oz (77.7 kg)   SpO2 98%   BMI 24.57 kg/m  General:   Well developed, NAD, BMI noted.  HEENT:  Normocephalic . Face symmetric, atraumatic Abdomen:  Not distended, soft, non-tender. No rebound or rigidity.   Skin: Not pale. Not jaundice Lower extremities: no pretibial edema bilaterally  Neurologic:  alert & oriented X3.  Speech normal, gait appropriate for age and unassisted Psych--  Cognition and judgment appear intact.  Cooperative with normal attention span and concentration.  Behavior appropriate. No anxious or depressed appearing.     Assessment     Assessment Increase LFTs 2016, hep B and C negative 06-2022: Calcium coronary score 0  PLAN R abdominal wall hernia: Suspect hernia, explained to patient what it is, observation versus surgical referral. Elected surgical referral, will arrange. Red flag symptoms discussed with patient. Preventive care: Flu shot today

## 2023-05-06 ENCOUNTER — Ambulatory Visit: Payer: Self-pay | Admitting: General Surgery

## 2023-05-06 NOTE — H&P (Signed)
Chief Complaint: Hernia       History of Present Illness: Patrick Berry is a 47 y.o. male who is seen today as an office consultation at the request of Dr. Drue Novel for evaluation of Hernia .     Patient is a 47 year old male, otherwise healthy who comes in secondary to a right inguinal hernia.  He states this been going on for approximately 3 to 4 weeks.  He states that he had some pain discomfort.  He states that he had some pain discomfort after a workout saw a bulge.  He states that since that time he notices a bulge and discomfort more at the end of the day after walking.   He had no previous abdominal surgery.   He had no previous signs or symptoms of incarceration or strangulation.   Review of Systems: A complete review of systems was obtained from the patient.  I have reviewed this information and discussed as appropriate with the patient.  See HPI as well for other ROS.   Review of Systems  Constitutional:  Negative for fever.  HENT:  Negative for congestion.   Eyes:  Negative for blurred vision.  Respiratory:  Negative for cough, shortness of breath and wheezing.   Cardiovascular:  Negative for chest pain and palpitations.  Gastrointestinal:  Negative for heartburn.  Genitourinary:  Negative for dysuria.  Musculoskeletal:  Negative for myalgias.  Skin:  Negative for rash.  Neurological:  Negative for dizziness and headaches.  Psychiatric/Behavioral:  Negative for depression and suicidal ideas.   All other systems reviewed and are negative.       Medical History: Past Medical History  History reviewed. No pertinent past medical history.      Problem List  There is no problem list on file for this patient.      Past Surgical History  History reviewed. No pertinent surgical history.      Allergies  No Known Allergies     Medications Ordered Prior to Encounter  No current outpatient medications on file prior to visit.    No current facility-administered  medications on file prior to visit.        Family History  History reviewed. No pertinent family history.      Tobacco Use History  Social History       Tobacco Use  Smoking Status Never  Smokeless Tobacco Never        Social History  Social History        Socioeconomic History   Marital status: Married  Tobacco Use   Smoking status: Never   Smokeless tobacco: Never    Social Drivers of Acupuncturist Strain: Low Risk  (04/22/2023)    Received from [facility]    Overall Financial Resource Strain (CARDIA)     Difficulty of Paying Living Expenses: Not hard at all  Food Insecurity: No Food Insecurity (04/22/2023)    Received from [facility]    Hunger Vital Sign     Worried About Running Out of Food in the Last Year: Never true     Ran Out of Food in the Last Year: Never true  Transportation Needs: No Transportation Needs (04/22/2023)    Received from [facility]     Lack of Transportation (Medical): No     Lack of Transportation (Non-Medical): No  Physical Activity: Sufficiently Active (04/22/2023)    Received from [facility]  Exercise Vital Sign     Days of Exercise per Week: 3 days     Minutes of Exercise per Session: 60 min  Stress: No Stress Concern Present (04/22/2023)    Received from [facility] of Occupational Health - Occupational Stress Questionnaire     Feeling of Stress : Only a little  Social Connections: Socially Integrated (04/22/2023)    Received from [facility]    Social Connection and Isolation Panel [NHANES]     Frequency of Communication with Friends and Family: More than three times a week     Frequency of Social Gatherings with Friends and Family: Once a week     Attends Religious Services: More than 4 times per year     Active Member of [employer] or Organizations: Yes     Attends Engineer, structural: More than 4 times per year     Marital Status: Married         Objective:         Vitals:    05/06/23 1044  BP: 110/76  Pulse: 102  Weight: 77 kg (169 lb 12.8 oz)  Height: 175.3 cm (5\' 9" )  PainSc: 0-No pain    Body mass index is 25.08 kg/m. Physical Exam Constitutional:      Appearance: Normal appearance.  HENT:     Head: Normocephalic and atraumatic.     Nose: Nose normal. No congestion.     Mouth/Throat:     Mouth: Mucous membranes are moist.     Pharynx: Oropharynx is clear.  Eyes:     Pupils: Pupils are equal, round, and reactive to light.  Cardiovascular:     Rate and Rhythm: Normal rate and regular rhythm.     Pulses: Normal pulses.     Heart sounds: Normal heart sounds. No murmur heard.    No friction rub. No gallop.  Pulmonary:     Effort: Pulmonary effort is normal. No respiratory distress.     Breath sounds: Normal breath sounds. No stridor. No wheezing, rhonchi or rales.  Abdominal:     General: Abdomen is flat.     Hernia: A hernia (left inguinal weakness) is present. Hernia is present in the left inguinal area and right inguinal area.  Musculoskeletal:        General: Normal range of motion.     Cervical back: Normal range of motion.  Skin:    General: Skin is warm and dry.  Neurological:     General: No focal deficit present.     Mental Status: He is alert and oriented to person, place, and time.  Psychiatric:        Mood and Affect: Mood normal.        Thought Content: Thought content normal.          Assessment and Plan:  Diagnoses and all orders for this visit:   Bilateral inguinal hernia without obstruction or gangrene, recurrence not specified     Patrick Berry is a 47 y.o. male     We will proceed to the OR for a laparoscopic bilateral inguinal hernia repair with mesh. All risks and benefits were discussed with the patient, to generally include infection, bleeding, damage to surrounding structures, acute and chronic nerve pain, and recurrence. Alternatives were offered and described.  All  questions were answered and the patient voiced understanding of the procedure and wishes to proceed at this point.  No follow-ups on file.   Axel Filler, MD, [facility], [place] [facility]

## 2023-05-06 NOTE — H&P (View-Only) (Signed)
Chief Complaint: Hernia       History of Present Illness: Patrick Berry is a 47 y.o. male who is seen today as an office consultation at the request of Dr. Drue Novel for evaluation of Hernia .     Patient is a 47 year old male, otherwise healthy who comes in secondary to a right inguinal hernia.  He states this been going on for approximately 3 to 4 weeks.  He states that he had some pain discomfort.  He states that he had some pain discomfort after a workout saw a bulge.  He states that since that time he notices a bulge and discomfort more at the end of the day after walking.   He had no previous abdominal surgery.   He had no previous signs or symptoms of incarceration or strangulation.   Review of Systems: A complete review of systems was obtained from the patient.  I have reviewed this information and discussed as appropriate with the patient.  See HPI as well for other ROS.   Review of Systems  Constitutional:  Negative for fever.  HENT:  Negative for congestion.   Eyes:  Negative for blurred vision.  Respiratory:  Negative for cough, shortness of breath and wheezing.   Cardiovascular:  Negative for chest pain and palpitations.  Gastrointestinal:  Negative for heartburn.  Genitourinary:  Negative for dysuria.  Musculoskeletal:  Negative for myalgias.  Skin:  Negative for rash.  Neurological:  Negative for dizziness and headaches.  Psychiatric/Behavioral:  Negative for depression and suicidal ideas.   All other systems reviewed and are negative.       Medical History: Past Medical History  History reviewed. No pertinent past medical history.      Problem List  There is no problem list on file for this patient.      Past Surgical History  History reviewed. No pertinent surgical history.      Allergies  No Known Allergies     Medications Ordered Prior to Encounter  No current outpatient medications on file prior to visit.    No current facility-administered  medications on file prior to visit.        Family History  History reviewed. No pertinent family history.      Tobacco Use History  Social History       Tobacco Use  Smoking Status Never  Smokeless Tobacco Never        Social History  Social History        Socioeconomic History   Marital status: Married  Tobacco Use   Smoking status: Never   Smokeless tobacco: Never    Social Drivers of Acupuncturist Strain: Low Risk  (04/22/2023)    Received from [facility]    Overall Financial Resource Strain (CARDIA)     Difficulty of Paying Living Expenses: Not hard at all  Food Insecurity: No Food Insecurity (04/22/2023)    Received from [facility]    Hunger Vital Sign     Worried About Running Out of Food in the Last Year: Never true     Ran Out of Food in the Last Year: Never true  Transportation Needs: No Transportation Needs (04/22/2023)    Received from [facility] - Transportation     Lack of Transportation (Medical): No     Lack of Transportation (Non-Medical): No  Physical Activity: Sufficiently Active (04/22/2023)    Received from [facility]  Exercise Vital Sign     Days of Exercise per Week: 3 days     Minutes of Exercise per Session: 60 min  Stress: No Stress Concern Present (04/22/2023)    Received from [facility] of Occupational Health - Occupational Stress Questionnaire     Feeling of Stress : Only a little  Social Connections: Socially Integrated (04/22/2023)    Received from [facility]    Social Connection and Isolation Panel [NHANES]     Frequency of Communication with Friends and Family: More than three times a week     Frequency of Social Gatherings with Friends and Family: Once a week     Attends Religious Services: More than 4 times per year     Active Member of [employer] or Organizations: Yes     Attends Engineer, structural: More than 4 times per year     Marital Status: Married         Objective:         Vitals:    05/06/23 1044  BP: 110/76  Pulse: 102  Weight: 77 kg (169 lb 12.8 oz)  Height: 175.3 cm (5\' 9" )  PainSc: 0-No pain    Body mass index is 25.08 kg/m. Physical Exam Constitutional:      Appearance: Normal appearance.  HENT:     Head: Normocephalic and atraumatic.     Nose: Nose normal. No congestion.     Mouth/Throat:     Mouth: Mucous membranes are moist.     Pharynx: Oropharynx is clear.  Eyes:     Pupils: Pupils are equal, round, and reactive to light.  Cardiovascular:     Rate and Rhythm: Normal rate and regular rhythm.     Pulses: Normal pulses.     Heart sounds: Normal heart sounds. No murmur heard.    No friction rub. No gallop.  Pulmonary:     Effort: Pulmonary effort is normal. No respiratory distress.     Breath sounds: Normal breath sounds. No stridor. No wheezing, rhonchi or rales.  Abdominal:     General: Abdomen is flat.     Hernia: A hernia (left inguinal weakness) is present. Hernia is present in the left inguinal area and right inguinal area.  Musculoskeletal:        General: Normal range of motion.     Cervical back: Normal range of motion.  Skin:    General: Skin is warm and dry.  Neurological:     General: No focal deficit present.     Mental Status: He is alert and oriented to person, place, and time.  Psychiatric:        Mood and Affect: Mood normal.        Thought Content: Thought content normal.          Assessment and Plan:  Diagnoses and all orders for this visit:   Bilateral inguinal hernia without obstruction or gangrene, recurrence not specified     Patrick Berry is a 47 y.o. male     We will proceed to the OR for a laparoscopic bilateral inguinal hernia repair with mesh. All risks and benefits were discussed with the patient, to generally include infection, bleeding, damage to surrounding structures, acute and chronic nerve pain, and recurrence. Alternatives were offered and described.  All  questions were answered and the patient voiced understanding of the procedure and wishes to proceed at this point.  No follow-ups on file.   Axel Filler, MD, [facility], [place] [facility]

## 2023-05-13 ENCOUNTER — Other Ambulatory Visit: Payer: Self-pay

## 2023-05-13 ENCOUNTER — Encounter (HOSPITAL_COMMUNITY): Payer: Self-pay | Admitting: General Surgery

## 2023-05-13 NOTE — Progress Notes (Signed)
SDW CALL  Patient was given pre-op instructions over the phone. The opportunity was given for the patient to ask questions. No further questions asked. Patient verbalized understanding of instructions given.   PCP - Dr. Willow Ora Cardiologist - denies  PPM/ICD - denies  Chest x-ray - denies EKG - 1.12.23 Stress Test - denies ECHO - denies Cardiac Cath - denies  Sleep Study - denies  Fasting Blood Sugar - denies  Blood Thinner Instructions: n/a Aspirin Instructions: n/a  ERAS Protcol - clears until 0615   COVID TEST-  n/a   Anesthesia review: no  Patient denies shortness of breath, fever, cough and chest pain over the phone call   All instructions explained to the patient, with a verbal understanding of the material. Patient agrees to go over the instructions while at home for a better understanding.

## 2023-05-13 NOTE — Progress Notes (Signed)
Surgical Instructions   Your procedure is scheduled on Tuesday, November 26th, 2024. Report to [facility] Main Entrance "A" at 6:45 A.M., then check in with the Admitting office. Any questions or running late day of surgery: call [phone]  Questions prior to your surgery date: call [phone], Monday-Friday, 8am-4pm. If you experience any cold or flu symptoms such as cough, fever, chills, shortness of breath, etc. between now and your scheduled surgery, please notify us at the above number.     Remember:  Do not eat after midnight the night before your surgery   You may drink clear liquids until 6:15 the morning of your surgery.   Clear liquids allowed are: Water, Non-Citrus Juices (without pulp), Carbonated Beverages, Clear Tea (no milk, honey, etc.), Black Coffee Only (NO MILK, CREAM OR POWDERED CREAMER of any kind), and Gatorade.    Take these medicines the morning of surgery with A SIP OF WATER: None   One week prior to surgery, STOP taking any Aspirin (unless otherwise instructed by your surgeon) Aleve, Naproxen, Ibuprofen, Motrin, Advil, Goody's, BC's, all herbal medications, fish oil, and non-prescription vitamins.           You will be asked to remove any contacts, glasses, piercing's, hearing aid's, dentures/partials prior to surgery. Please bring cases for these items if needed.    Patients discharged the day of surgery will not be allowed to drive home, and someone needs to stay with them for 24 hours.  SURGICAL WAITING ROOM VISITATION Patients may have no more than 2 support people in the waiting area - these visitors may rotate.   Pre-op nurse will coordinate an appropriate time for 1 ADULT support person, who may not rotate, to accompany patient in pre-op.  Children under the age of 46 must have an adult with them who is not the patient and must remain in the main waiting area with an adult.  If the patient needs to stay at the hospital during part of their  recovery, the visitor guidelines for inpatient rooms apply.  Please refer to the [facility] website for the visitor guidelines for any additional information.

## 2023-05-16 NOTE — Anesthesia Preprocedure Evaluation (Signed)
Anesthesia Evaluation  Patient identified by MRN, date of birth, ID band Patient awake    Reviewed: Allergy & Precautions, NPO status , Patient's Chart, lab work & pertinent test results  Airway Mallampati: III  TM Distance: >3 FB Neck ROM: Full    Dental  (+) Dental Advisory Given, Teeth Intact   Pulmonary Current Smoker and Patient abstained from smoking.   Pulmonary exam normal breath sounds clear to auscultation       Cardiovascular negative cardio ROS Normal cardiovascular exam Rhythm:Regular Rate:Normal     Neuro/Psych  PSYCHIATRIC DISORDERS   Bipolar Disorder   negative neurological ROS     GI/Hepatic negative GI ROS, Neg liver ROS,,,  Endo/Other  negative endocrine ROS    Renal/GU negative Renal ROS     Musculoskeletal negative musculoskeletal ROS (+)    Abdominal   Peds  Hematology negative hematology ROS (+)   Anesthesia Other Findings   Reproductive/Obstetrics                             Anesthesia Physical Anesthesia Plan  ASA: 2  Anesthesia Plan: General   Post-op Pain Management: Tylenol PO (pre-op)* and Gabapentin PO (pre-op)*   Induction: Intravenous, Rapid sequence and Cricoid pressure planned  PONV Risk Score and Plan: 4 or greater and Ondansetron, Dexamethasone, Treatment may vary due to age or medical condition and Midazolam  Airway Management Planned: Oral ETT  Additional Equipment:   Intra-op Plan:   Post-operative Plan: Extubation in OR  Informed Consent: I have reviewed the patients History and Physical, chart, labs and discussed the procedure including the risks, benefits and alternatives for the proposed anesthesia with the patient or authorized representative who has indicated his/her understanding and acceptance.     Dental advisory given  Plan Discussed with: CRNA  Anesthesia Plan Comments:        Anesthesia Quick Evaluation

## 2023-05-17 ENCOUNTER — Other Ambulatory Visit: Payer: Self-pay

## 2023-05-17 ENCOUNTER — Encounter (HOSPITAL_COMMUNITY): Payer: Self-pay | Admitting: General Surgery

## 2023-05-17 ENCOUNTER — Encounter (HOSPITAL_COMMUNITY)
Admission: RE | Disposition: A | Discharge status: Home / Self Care | Payer: Self-pay | Source: Home / Self Care | Attending: General Surgery

## 2023-05-17 ENCOUNTER — Ambulatory Visit (HOSPITAL_BASED_OUTPATIENT_CLINIC_OR_DEPARTMENT_OTHER): Payer: Self-pay | Admitting: Anesthesiology

## 2023-05-17 ENCOUNTER — Ambulatory Visit (HOSPITAL_COMMUNITY)
Admission: RE | Admit: 2023-05-17 | Discharge: 2023-05-17 | Disposition: A | Discharge status: Home / Self Care | Payer: Managed Care, Other (non HMO) | Attending: General Surgery | Admitting: General Surgery

## 2023-05-17 ENCOUNTER — Ambulatory Visit (HOSPITAL_COMMUNITY): Payer: Self-pay | Admitting: Anesthesiology

## 2023-05-17 ENCOUNTER — Other Ambulatory Visit (HOSPITAL_COMMUNITY): Payer: Self-pay

## 2023-05-17 DIAGNOSIS — K4020 Bilateral inguinal hernia, without obstruction or gangrene, not specified as recurrent: Secondary | ICD-10-CM

## 2023-05-17 HISTORY — PX: INGUINAL HERNIA REPAIR: SHX194

## 2023-05-17 LAB — CBC
HCT: 44.1 % (ref 39.0–52.0)
Hemoglobin: 15 g/dL (ref 13.0–17.0)
MCH: 28 pg (ref 26.0–34.0)
MCHC: 34 g/dL (ref 30.0–36.0)
MCV: 82.3 fL (ref 80.0–100.0)
Platelets: 241 10*3/uL (ref 150–400)
RBC: 5.36 MIL/uL (ref 4.22–5.81)
RDW: 12.5 % (ref 11.5–15.5)
WBC: 5.5 10*3/uL (ref 4.0–10.5)
nRBC: 0 % (ref 0.0–0.2)

## 2023-05-17 SURGERY — REPAIR, HERNIA, INGUINAL, BILATERAL, LAPAROSCOPIC
Anesthesia: General | Site: Abdomen | Laterality: Bilateral

## 2023-05-17 MED ORDER — TRAMADOL HCL 50 MG PO TABS
50.0000 mg | ORAL_TABLET | Freq: Four times a day (QID) | ORAL | 0 refills | Status: AC | PRN
  Filled 2023-05-17: qty 20, 5d supply, fill #0

## 2023-05-17 MED ORDER — CHLORHEXIDINE GLUCONATE 0.12 % MT SOLN
OROMUCOSAL | Status: AC
  Administered 2023-05-17: 15 mL via OROMUCOSAL
  Filled 2023-05-17: qty 15

## 2023-05-17 MED ORDER — CEFAZOLIN SODIUM-DEXTROSE 2-4 GM/100ML-% IV SOLN
INTRAVENOUS | Status: AC
  Filled 2023-05-17: qty 100

## 2023-05-17 MED ORDER — 0.9 % SODIUM CHLORIDE (POUR BTL) OPTIME
TOPICAL | Status: DC | PRN
  Administered 2023-05-17: 1000 mL

## 2023-05-17 MED ORDER — EPHEDRINE 5 MG/ML INJ
INTRAVENOUS | Status: AC
  Filled 2023-05-17: qty 5

## 2023-05-17 MED ORDER — ORAL CARE MOUTH RINSE: 15.0000 mL | Freq: Once | OROMUCOSAL | Status: AC

## 2023-05-17 MED ORDER — CHLORHEXIDINE GLUCONATE CLOTH 2 % EX PADS: 6.0000 | MEDICATED_PAD | Freq: Once | CUTANEOUS | Status: DC

## 2023-05-17 MED ORDER — FENTANYL CITRATE (PF) 250 MCG/5ML IJ SOLN
INTRAMUSCULAR | Status: DC | PRN
  Administered 2023-05-17: 100 ug via INTRAVENOUS

## 2023-05-17 MED ORDER — ACETAMINOPHEN 500 MG PO TABS
ORAL_TABLET | ORAL | Status: AC
  Administered 2023-05-17: 1000 mg via ORAL
  Filled 2023-05-17: qty 2

## 2023-05-17 MED ORDER — EPHEDRINE SULFATE-NACL 50-0.9 MG/10ML-% IV SOSY
PREFILLED_SYRINGE | INTRAVENOUS | Status: DC | PRN
  Administered 2023-05-17: 10 mg via INTRAVENOUS

## 2023-05-17 MED ORDER — BUPIVACAINE HCL (PF) 0.25 % IJ SOLN
INTRAMUSCULAR | Status: AC
  Filled 2023-05-17: qty 30

## 2023-05-17 MED ORDER — FENTANYL CITRATE (PF) 250 MCG/5ML IJ SOLN
INTRAMUSCULAR | Status: AC
  Filled 2023-05-17: qty 5

## 2023-05-17 MED ORDER — DROPERIDOL 2.5 MG/ML IJ SOLN: 0.6250 mg | Freq: Once | INTRAMUSCULAR | Status: DC | PRN

## 2023-05-17 MED ORDER — DEXAMETHASONE SODIUM PHOSPHATE 10 MG/ML IJ SOLN
INTRAMUSCULAR | Status: AC
  Filled 2023-05-17: qty 1

## 2023-05-17 MED ORDER — ROCURONIUM BROMIDE 10 MG/ML (PF) SYRINGE
PREFILLED_SYRINGE | INTRAVENOUS | Status: DC | PRN
  Administered 2023-05-17: 50 mg via INTRAVENOUS

## 2023-05-17 MED ORDER — DEXAMETHASONE SODIUM PHOSPHATE 10 MG/ML IJ SOLN
INTRAMUSCULAR | Status: DC | PRN
  Administered 2023-05-17: 10 mg via INTRAVENOUS

## 2023-05-17 MED ORDER — LACTATED RINGERS IV SOLN: INTRAVENOUS | Status: DC

## 2023-05-17 MED ORDER — OXYCODONE HCL 5 MG/5ML PO SOLN
ORAL | Status: AC
  Filled 2023-05-17: qty 5

## 2023-05-17 MED ORDER — ONDANSETRON HCL 4 MG/2ML IJ SOLN
INTRAMUSCULAR | Status: AC
  Filled 2023-05-17: qty 2

## 2023-05-17 MED ORDER — LIDOCAINE 2% (20 MG/ML) 5 ML SYRINGE
INTRAMUSCULAR | Status: DC | PRN
  Administered 2023-05-17: 80 mg via INTRAVENOUS

## 2023-05-17 MED ORDER — ENSURE PRE-SURGERY PO LIQD: 296.0000 mL | Freq: Once | ORAL | Status: DC

## 2023-05-17 MED ORDER — CEFAZOLIN SODIUM-DEXTROSE 2-4 GM/100ML-% IV SOLN
2.0000 g | INTRAVENOUS | Status: AC
  Administered 2023-05-17: 2 g via INTRAVENOUS

## 2023-05-17 MED ORDER — PROPOFOL 10 MG/ML IV BOLUS
INTRAVENOUS | Status: AC
  Filled 2023-05-17: qty 20

## 2023-05-17 MED ORDER — PHENYLEPHRINE 80 MCG/ML (10ML) SYRINGE FOR IV PUSH (FOR BLOOD PRESSURE SUPPORT)
PREFILLED_SYRINGE | INTRAVENOUS | Status: DC | PRN
  Administered 2023-05-17: 80 ug via INTRAVENOUS

## 2023-05-17 MED ORDER — GABAPENTIN 300 MG PO CAPS
ORAL_CAPSULE | ORAL | Status: AC
  Administered 2023-05-17: 300 mg via ORAL
  Filled 2023-05-17: qty 1

## 2023-05-17 MED ORDER — ACETAMINOPHEN 500 MG PO TABS: 1000.0000 mg | ORAL_TABLET | Freq: Once | ORAL | Status: AC

## 2023-05-17 MED ORDER — CHLORHEXIDINE GLUCONATE 0.12 % MT SOLN: 15.0000 mL | Freq: Once | OROMUCOSAL | Status: AC

## 2023-05-17 MED ORDER — MEPERIDINE HCL 25 MG/ML IJ SOLN: 6.2500 mg | INTRAMUSCULAR | Status: DC | PRN

## 2023-05-17 MED ORDER — BUPIVACAINE HCL (PF) 0.25 % IJ SOLN
INTRAMUSCULAR | Status: DC | PRN
  Administered 2023-05-17: 20 mL

## 2023-05-17 MED ORDER — KETOROLAC TROMETHAMINE 30 MG/ML IJ SOLN
INTRAMUSCULAR | Status: DC | PRN
  Administered 2023-05-17: 30 mg via INTRAVENOUS

## 2023-05-17 MED ORDER — LIDOCAINE 2% (20 MG/ML) 5 ML SYRINGE
INTRAMUSCULAR | Status: AC
  Filled 2023-05-17: qty 5

## 2023-05-17 MED ORDER — HYDROMORPHONE HCL 1 MG/ML IJ SOLN
0.2500 mg | INTRAMUSCULAR | Status: DC | PRN
  Administered 2023-05-17 (×2): 0.5 mg via INTRAVENOUS

## 2023-05-17 MED ORDER — PHENYLEPHRINE 80 MCG/ML (10ML) SYRINGE FOR IV PUSH (FOR BLOOD PRESSURE SUPPORT)
PREFILLED_SYRINGE | INTRAVENOUS | Status: AC
  Filled 2023-05-17: qty 10

## 2023-05-17 MED ORDER — OXYCODONE HCL 5 MG/5ML PO SOLN
5.0000 mg | Freq: Once | ORAL | Status: AC | PRN
  Administered 2023-05-17: 5 mg via ORAL

## 2023-05-17 MED ORDER — GABAPENTIN 300 MG PO CAPS: 300.0000 mg | ORAL_CAPSULE | Freq: Once | ORAL | Status: AC

## 2023-05-17 MED ORDER — ROCURONIUM BROMIDE 10 MG/ML (PF) SYRINGE
PREFILLED_SYRINGE | INTRAVENOUS | Status: AC
  Filled 2023-05-17: qty 10

## 2023-05-17 MED ORDER — HYDROMORPHONE HCL 1 MG/ML IJ SOLN
INTRAMUSCULAR | Status: AC
  Filled 2023-05-17: qty 1

## 2023-05-17 MED ORDER — PROPOFOL 10 MG/ML IV BOLUS
INTRAVENOUS | Status: DC | PRN
  Administered 2023-05-17: 150 mg via INTRAVENOUS

## 2023-05-17 MED ORDER — ONDANSETRON HCL 4 MG/2ML IJ SOLN
INTRAMUSCULAR | Status: DC | PRN
  Administered 2023-05-17: 4 mg via INTRAVENOUS

## 2023-05-17 MED ORDER — SUGAMMADEX SODIUM 200 MG/2ML IV SOLN
INTRAVENOUS | Status: DC | PRN
  Administered 2023-05-17: 150 mg via INTRAVENOUS

## 2023-05-17 MED ORDER — MIDAZOLAM HCL 2 MG/2ML IJ SOLN
INTRAMUSCULAR | Status: AC
Start: 2023-05-17 — End: ?
  Filled 2023-05-17: qty 2

## 2023-05-17 MED ORDER — OXYCODONE HCL 5 MG PO TABS: 5.0000 mg | ORAL_TABLET | Freq: Once | ORAL | Status: AC | PRN

## 2023-05-17 MED ORDER — MIDAZOLAM HCL 2 MG/2ML IJ SOLN
INTRAMUSCULAR | Status: DC | PRN
  Administered 2023-05-17: 2 mg via INTRAVENOUS

## 2023-05-17 SURGICAL SUPPLY — 40 items
APPLIER CLIP LOGIC TI 5 (MISCELLANEOUS) IMPLANT
BAG COUNTER SPONGE SURGICOUNT (BAG) ×2 IMPLANT
CANISTER SUCT 3000ML PPV (MISCELLANEOUS) IMPLANT
CHLORAPREP W/TINT 26 (MISCELLANEOUS) ×2 IMPLANT
COVER SURGICAL LIGHT HANDLE (MISCELLANEOUS) ×2 IMPLANT
DERMABOND ADVANCED .7 DNX12 (GAUZE/BANDAGES/DRESSINGS) ×2 IMPLANT
DISSECTOR BLUNT TIP ENDO 5MM (MISCELLANEOUS) IMPLANT
DRAPE LAPAROSCOPIC ABDOMINAL (DRAPES) ×2 IMPLANT
ELECT REM PT RETURN 9FT ADLT (ELECTROSURGICAL) ×1
ELECTRODE REM PT RTRN 9FT ADLT (ELECTROSURGICAL) ×2 IMPLANT
GLOVE BIO SURGEON STRL SZ7.5 (GLOVE) ×4 IMPLANT
GOWN STRL REUS W/ TWL LRG LVL3 (GOWN DISPOSABLE) ×4 IMPLANT
GOWN STRL REUS W/ TWL XL LVL3 (GOWN DISPOSABLE) ×2 IMPLANT
IRRIG SUCT STRYKERFLOW 2 WTIP (MISCELLANEOUS)
IRRIGATION SUCT STRKRFLW 2 WTP (MISCELLANEOUS) IMPLANT
KIT BASIN OR (CUSTOM PROCEDURE TRAY) ×2 IMPLANT
KIT TURNOVER KIT B (KITS) ×2 IMPLANT
MESH 3DMAX MID 5X7 LT XLRG (Mesh General) IMPLANT
MESH 3DMAX MID 5X7 RT XLRG (Mesh General) IMPLANT
NDL INSUFFLATION 14GA 120MM (NEEDLE) IMPLANT
NEEDLE INSUFFLATION 14GA 120MM (NEEDLE) IMPLANT
NS IRRIG 1000ML POUR BTL (IV SOLUTION) ×2 IMPLANT
PAD ARMBOARD 7.5X6 YLW CONV (MISCELLANEOUS) ×4 IMPLANT
RELOAD STAPLE 4.0 BLU F/HERNIA (INSTRUMENTS) IMPLANT
RELOAD STAPLE 4.8 BLK F/HERNIA (STAPLE) IMPLANT
RELOAD STAPLE HERNIA 4.0 BLUE (INSTRUMENTS) IMPLANT
RELOAD STAPLE HERNIA 4.8 BLK (STAPLE) IMPLANT
SCISSORS LAP 5X35 DISP (ENDOMECHANICALS) ×2 IMPLANT
SET TUBE SMOKE EVAC HIGH FLOW (TUBING) ×2 IMPLANT
STAPLER HERNIA 12 8.5 360D (INSTRUMENTS) IMPLANT
SUT MNCRL AB 4-0 PS2 18 (SUTURE) ×2 IMPLANT
SUT VIC AB 1 CT1 27XBRD ANBCTR (SUTURE) IMPLANT
TOWEL GREEN STERILE (TOWEL DISPOSABLE) ×2 IMPLANT
TOWEL GREEN STERILE FF (TOWEL DISPOSABLE) ×2 IMPLANT
TRAY LAPAROSCOPIC MC (CUSTOM PROCEDURE TRAY) ×2 IMPLANT
TROCAR OPTICAL SHORT 5MM (TROCAR) ×2 IMPLANT
TROCAR OPTICAL SLV SHORT 5MM (TROCAR) ×2 IMPLANT
TROCAR Z THREAD OPTICAL 12X100 (TROCAR) ×2 IMPLANT
WARMER LAPAROSCOPE (MISCELLANEOUS) ×2 IMPLANT
WATER STERILE IRR 1000ML POUR (IV SOLUTION) ×2 IMPLANT

## 2023-05-17 NOTE — Transfer of Care (Signed)
Immediate Anesthesia Transfer of Care Note  Patient: Patrick Berry  Procedure(s) Performed: LAPAROSCOPIC BILATERAL INGUINAL HERNIA REPAIR WITH MESH (Bilateral: Abdomen)  Patient Location: PACU  Anesthesia Type:General  Level of Consciousness: awake and sedated  Airway & Oxygen Therapy: Patient Spontanous Breathing and Patient connected to face mask oxygen  Post-op Assessment: Report given to RN and Post -op Vital signs reviewed and stable  Post vital signs: Reviewed and stable  Last Vitals:  Vitals Value Taken Time  BP 137/89 05/17/23 1005  Temp    Pulse 81 05/17/23 1007  Resp 17 05/17/23 1007  SpO2 100 % 05/17/23 1007  Vitals shown include unfiled device data.  Last Pain:  Vitals:   05/17/23 0716  TempSrc:   PainSc: 0-No pain         Complications: No notable events documented.

## 2023-05-17 NOTE — Interval H&P Note (Signed)
History and Physical Interval Note:  05/17/2023 8:20 AM  Patrick Berry  has presented today for surgery, with the diagnosis of BILATERAL INGUINAL HERNIA.  The various methods of treatment have been discussed with the patient and family. After consideration of risks, benefits and other options for treatment, the patient has consented to  Procedure(s): LAPAROSCOPIC BILATERAL INGUINAL HERNIA REPAIR WITH MESH (Bilateral) as a surgical intervention.  The patient's history has been reviewed, patient examined, no change in status, stable for surgery.  I have reviewed the patient's chart and labs.  Questions were answered to the patient's satisfaction.     Axel Filler

## 2023-05-17 NOTE — Discharge Instructions (Signed)
[facility] _____[facility]   INGUINAL HERNIA REPAIR: POST OP INSTRUCTIONS  Always review your discharge instruction sheet given to you by the facility where your surgery was performed. IF YOU HAVE DISABILITY OR FAMILY LEAVE FORMS, YOU MUST BRING THEM TO THE OFFICE FOR PROCESSING.   DO NOT GIVE THEM TO YOUR DOCTOR.  1. A  prescription for pain medication may be given to you upon discharge.  Take your pain medication as prescribed, if needed.  If narcotic pain medicine is not needed, then you may take acetaminophen (Tylenol) or ibuprofen (Advil) as needed. 2. Take your usually prescribed medications unless otherwise directed. If you need a refill on your pain medication, please contact your pharmacy.  They will contact our office to request authorization. Prescriptions will not be filled after 5 pm or on week-ends. 3. You should follow a light diet the first 24 hours after arrival home, such as soup and crackers, etc.  Be sure to include lots of fluids daily.  Resume your normal diet the day after surgery. 4.Most patients will experience some swelling and bruising around the umbilicus or in the groin and scrotum.  Ice packs and reclining will help.  Swelling and bruising can take several days to resolve.  6. It is common to experience some constipation if taking pain medication after surgery.  Increasing fluid intake and taking a stool softener (such as Colace) will usually help or prevent this problem from occurring.  A mild laxative (Milk of Magnesia or Miralax) should be taken according to package directions if there are no bowel movements after 48 hours. 7. Unless discharge instructions indicate otherwise, you may remove your bandages 24-48 hours after surgery, and you may shower at that time.  You may have steri-strips (small skin tapes) in place directly over the incision.  These strips should be left on the skin for 7-10 days.  If your surgeon used skin glue on the incision, you may  shower in 24 hours.  The glue will flake off over the next 2-3 weeks.  Any sutures or staples will be removed at the office during your follow-up visit. 8. ACTIVITIES:  You may resume regular (light) daily activities beginning the next day--such as daily self-care, walking, climbing stairs--gradually increasing activities as tolerated.  You may have sexual intercourse when it is comfortable.  Refrain from any heavy lifting or straining until approved by your doctor.  a.You may drive when you are no longer taking prescription pain medication, you can comfortably wear a seatbelt, and you can safely maneuver your car and apply brakes. b.RETURN TO WORK:   _____________________________________________  9.You should see your doctor in the office for a follow-up appointment approximately 2-3 weeks after your surgery.  Make sure that you call for this appointment within a day or two after you arrive home to insure a convenient appointment time. 10.OTHER INSTRUCTIONS: _________________________    _____________________________________  WHEN TO CALL YOUR DOCTOR: Fever over 101.0 Inability to urinate Nausea and/or vomiting Extreme swelling or bruising Continued bleeding from incision. Increased pain, redness, or drainage from the incision  The clinic staff is available to answer your questions during regular business hours.  Please don't hesitate to call and ask to speak to one of the nurses for clinical concerns.  If you have a medical emergency, go to the nearest emergency room or call 911.  A surgeon from [facility] is always on call at the hospital   [address], [place],  [place]  [address] ?  [address] [address], [place], [place]   [address] [phone] ? [phone] ? FAX [phone] Web site: [email or web address]

## 2023-05-17 NOTE — Anesthesia Postprocedure Evaluation (Signed)
Anesthesia Post Note  Patient: Labaron Dibenedetto  Procedure(s) Performed: LAPAROSCOPIC BILATERAL INGUINAL HERNIA REPAIR WITH MESH (Bilateral: Abdomen)     Patient location during evaluation: PACU Anesthesia Type: General Level of consciousness: sedated and patient cooperative Pain management: pain level controlled Vital Signs Assessment: post-procedure vital signs reviewed and stable Respiratory status: spontaneous breathing Cardiovascular status: stable Anesthetic complications: no   No notable events documented.  Last Vitals:  Vitals:   05/17/23 1045 05/17/23 1100  BP: 120/78 108/83  Pulse: 70 70  Resp: 12 14  Temp:  36.6 C  SpO2: 97% 97%    Last Pain:  Vitals:   05/17/23 1100  TempSrc:   PainSc: 3                  Lewie Loron

## 2023-05-17 NOTE — Anesthesia Procedure Notes (Signed)
Procedure Name: Intubation Date/Time: 05/17/2023 9:05 AM  Performed by: Alwyn Ren, CRNAPre-anesthesia Checklist: Patient identified, Emergency Drugs available, Suction available and Patient being monitored Patient Re-evaluated:Patient Re-evaluated prior to induction Oxygen Delivery Method: Circle system utilized Preoxygenation: Pre-oxygenation with 100% oxygen Induction Type: IV induction Ventilation: Mask ventilation without difficulty Laryngoscope Size: Miller and 2 Grade View: Grade II Tube type: Oral Tube size: 7.0 mm Number of attempts: 1 Airway Equipment and Method: Stylet and Oral airway Placement Confirmation: ETT inserted through vocal cords under direct vision, positive ETCO2 and breath sounds checked- equal and bilateral Secured at: 22 cm Tube secured with: Tape Dental Injury: Teeth and Oropharynx as per pre-operative assessment

## 2023-05-17 NOTE — Op Note (Signed)
05/17/2023  9:51 AM  PATIENT:  Lincon Kvamme  47 y.o. male  PRE-OPERATIVE DIAGNOSIS:  BILATERAL INGUINAL HERNIA  POST-OPERATIVE DIAGNOSIS:  BILATERAL INGUINAL HERNIA, right pantaloon, left indirect  PROCEDURE:  Procedure(s): LAPAROSCOPIC BILATERAL INGUINAL HERNIA REPAIR WITH MESH (Bilateral)  SURGEON:  Surgeons and Role:    Axel Filler, MD - Primary  ASSISTANTS: Rockwell Germany, RNFA   ANESTHESIA:   local and general  EBL:  10 mL   BLOOD ADMINISTERED:none  DRAINS: none   LOCAL MEDICATIONS USED:  BUPIVICAINE   SPECIMEN:  No Specimen  DISPOSITION OF SPECIMEN:  N/A  COUNTS:  YES  TOURNIQUET:  * No tourniquets in log *  DICTATION: .Dragon Dictation  Counts: reported as correct x 2  Findings:  The patient had a small pantaloon right & left indirect hernia  Indications for procedure:  The patient is a 47 year old male with a bilateral inguinal hernias for several months. Patient complained of symptomatology to his bilateral inguinal areas. The patient was taken back for elective inguinal hernia repair.  Details of the procedure:  The patient was taken back to the operating room. The patient was placed in supine position with bilateral SCDs in place.  The patient underwent GETA.  A foley catheter was placed. The patient was prepped and draped in the usual sterile fashion.  After appropriate anitbiotics were confirmed, a time-out was confirmed and all facts were verified.  0.25% Marcaine was used to infiltrate the umbilical area. A 11-blade was used to cut down the skin and blunt dissection was used to get the anterior fashion.  The anterior fascia was incised approximately 1 cm and the muscles were retracted laterally. Blunt dissection was then used to create a space in the preperitoneal area. At this time a 10 mm camera was then introduced into the space and advanced the pubic tubercle and a 12 mm trocar was placed over this and insufflation was started.  At this  time and space was created from medial to laterally the preperitoneal space.  There was a small direct hernia defect and the transversalis fascia was dissected away.  Cooper's ligament was initially cleaned off.  The hernia sac was identified and dissected away from the cremesteric muscle fibers.  The hernia was seen in the indirect space. Dissection of the hernia sac was undertaken the vas deferens was identified and protected in all parts of the case.    The exact same dissection took place on the left side. The spermatic cord was identified.  The hernia sac was identified and dissected away from the cremesteric muscle fibers.  The hernia was seen in the indirect space. Dissection of the hernia sac was undertaken the vas deferens was identified and protected in all parts of the case.   Once the hernia sac was taken down to approximately the umbilicus a Bard 3D Max mesh, size: XLarge, left and right sided, was  introduced into the preperitoneal space.  The mesh was brought over to cover the direct and indirect hernia spaces.  This was anchored into place and secured to Cooper's ligament with 4.55mm staples from a Coviden hernia stapler. It was anchored to the anterior abdominal wall with 4.8 mm staples. The hernia sac was seen lying posterior to the mesh. There was no staples placed laterally.   The insufflation was evacuated and the peritoneum was seen posterior to the mesh bilaterally. The trochars were removed. The anterior fascia was reapproximated using #1 Vicryl on a UR- 6.  Intra-abdominal air was  evacuated and the Veress needle removed. The skin was reapproximated using 4-0 Monocryl subcuticular fashion and was dressed with Dermabond. The  patient was awakened from general anesthesia and taken to recovery in stable condition.   PLAN OF CARE: Discharge to home after PACU  PATIENT DISPOSITION:  PACU - hemodynamically stable.   Delay start of Pharmacological VTE agent (>24hrs) due to surgical  blood loss or risk of bleeding: not applicable

## 2023-05-18 ENCOUNTER — Encounter (HOSPITAL_COMMUNITY): Payer: Self-pay | Admitting: General Surgery

## 2023-05-20 ENCOUNTER — Telehealth: Payer: Managed Care, Other (non HMO)

## 2023-07-02 ENCOUNTER — Encounter: Payer: Self-pay | Admitting: Internal Medicine

## 2023-07-02 DIAGNOSIS — Z0000 Encounter for general adult medical examination without abnormal findings: Secondary | ICD-10-CM

## 2023-07-04 NOTE — Telephone Encounter (Signed)
 Orders placed.

## 2023-07-04 NOTE — Addendum Note (Signed)
 Addended byConrad Livingston D on: 07/04/2023 10:24 AM   Modules accepted: Orders

## 2023-07-04 NOTE — Telephone Encounter (Signed)
 Labs for CPX: CMP FLP CBC A1c PSA Let patient know he can schedule blood work at his earliest convenience

## 2023-07-05 ENCOUNTER — Other Ambulatory Visit (INDEPENDENT_AMBULATORY_CARE_PROVIDER_SITE_OTHER): Payer: 59

## 2023-07-05 DIAGNOSIS — Z0000 Encounter for general adult medical examination without abnormal findings: Secondary | ICD-10-CM

## 2023-07-05 LAB — CBC WITH DIFFERENTIAL/PLATELET
Basophils Absolute: 0 10*3/uL (ref 0.0–0.1)
Basophils Relative: 0.6 % (ref 0.0–3.0)
Eosinophils Absolute: 0.2 10*3/uL (ref 0.0–0.7)
Eosinophils Relative: 3.3 % (ref 0.0–5.0)
HCT: 44.6 % (ref 39.0–52.0)
Hemoglobin: 14.8 g/dL (ref 13.0–17.0)
Lymphocytes Relative: 40.4 % (ref 12.0–46.0)
Lymphs Abs: 2.1 10*3/uL (ref 0.7–4.0)
MCHC: 33.3 g/dL (ref 30.0–36.0)
MCV: 86 fL (ref 78.0–100.0)
Monocytes Absolute: 0.4 10*3/uL (ref 0.1–1.0)
Monocytes Relative: 7.9 % (ref 3.0–12.0)
Neutro Abs: 2.5 10*3/uL (ref 1.4–7.7)
Neutrophils Relative %: 47.8 % (ref 43.0–77.0)
Platelets: 270 10*3/uL (ref 150.0–400.0)
RBC: 5.18 Mil/uL (ref 4.22–5.81)
RDW: 12.9 % (ref 11.5–15.5)
WBC: 5.2 10*3/uL (ref 4.0–10.5)

## 2023-07-05 LAB — COMPREHENSIVE METABOLIC PANEL
ALT: 19 U/L (ref 0–53)
AST: 16 U/L (ref 0–37)
Albumin: 4.7 g/dL (ref 3.5–5.2)
Alkaline Phosphatase: 48 U/L (ref 39–117)
BUN: 10 mg/dL (ref 6–23)
CO2: 29 meq/L (ref 19–32)
Calcium: 9.8 mg/dL (ref 8.4–10.5)
Chloride: 99 meq/L (ref 96–112)
Creatinine, Ser: 0.96 mg/dL (ref 0.40–1.50)
GFR: 94.13 mL/min (ref >60.00)
Glucose, Bld: 96 mg/dL (ref 70–99)
Potassium: 4.7 meq/L (ref 3.5–5.1)
Sodium: 135 meq/L (ref 135–145)
Total Bilirubin: 0.6 mg/dL (ref 0.2–1.2)
Total Protein: 7.2 g/dL (ref 6.0–8.3)

## 2023-07-05 LAB — LIPID PANEL
Cholesterol: 214 mg/dL — ABNORMAL HIGH (ref 0–200)
HDL: 48.3 mg/dL (ref >39.00)
LDL Cholesterol: 125 mg/dL — ABNORMAL HIGH (ref 0–99)
NonHDL: 165.72
Total CHOL/HDL Ratio: 4
Triglycerides: 203 mg/dL — ABNORMAL HIGH (ref 0.0–149.0)
VLDL: 40.6 mg/dL — ABNORMAL HIGH (ref 0.0–40.0)

## 2023-07-05 LAB — HEMOGLOBIN A1C: Hgb A1c MFr Bld: 5.7 % (ref 4.6–6.5)

## 2023-07-05 LAB — PSA: PSA: 1.45 ng/mL (ref 0.10–4.00)

## 2023-07-08 ENCOUNTER — Encounter: Payer: Self-pay | Admitting: Internal Medicine

## 2023-07-08 ENCOUNTER — Ambulatory Visit (INDEPENDENT_AMBULATORY_CARE_PROVIDER_SITE_OTHER): Payer: 59 | Admitting: Internal Medicine

## 2023-07-08 VITALS — BP 126/64 | HR 95 | Temp 97.8°F | Resp 16 | Ht 70.0 in | Wt 170.5 lb

## 2023-07-08 DIAGNOSIS — Z0000 Encounter for general adult medical examination without abnormal findings: Secondary | ICD-10-CM | POA: Diagnosis not present

## 2023-07-08 NOTE — Assessment & Plan Note (Signed)
Here for CPX Td 2017 Had a flu shot COVID VAX: booster recommended Prostate cancer screening: No symptoms, PSA WNL CCS: No FH, no symptoms, 3 options discussed, we agreed on IFOB for now. Labs: Recently done, reviewed with the patient.  All okay. Exercises regularly, diet is healthy most of the time.

## 2023-07-08 NOTE — Progress Notes (Signed)
Subjective:    Patient ID: Patrick Berry, male    DOB: Jun 24, 1975, 48 y.o.   MRN: [id number]  DOS:  07/08/2023 Type of visit - description: cpx  Here for CPX Had a hernia repair a few weeks ago, area is healing well. Did develop constipation after he took a couple of painkillers. Still taking Metamucil. Denies fever or chills.  Review of Systems  Other than above, a 14 point review of systems is negative     Past Medical History:  Diagnosis Date   H/O abnormal weight loss     Past Surgical History:  Procedure Laterality Date   INGUINAL HERNIA REPAIR Bilateral 05/17/2023   Procedure: LAPAROSCOPIC BILATERAL INGUINAL HERNIA REPAIR WITH MESH;  Surgeon: Axel Filler, MD;  Location: [facility] OR;  Service: General;  Laterality: Bilateral;   Social History   Socioeconomic History   Marital status: Married    Spouse name: Not on file   Number of children: 2   Years of education: Not on file   Highest education level: Bachelor's degree (e.g., BA, AB, BS)  Occupational History   OccupationEducation officer, environmental, [employer]  Tobacco Use   Smoking status: Never   Smokeless tobacco: Never  Vaping Use   Vaping status: Never Used  Substance and Sexual Activity   Alcohol use: Yes    Alcohol/week: 0.0 standard drinks of alcohol    Comment: occasionally    Drug use: No   Sexual activity: Not on file  Other Topics Concern   Not on file  Social History Narrative   Original from [place]   Children 2010 and 2006   Social Drivers of Health   Financial Resource Strain: Low Risk  (04/22/2023)   Overall Financial Resource Strain (CARDIA)    Difficulty of Paying Living Expenses: Not hard at all  Food Insecurity: No Food Insecurity (04/22/2023)   Hunger Vital Sign    Worried About Running Out of Food in the Last Year: Never true    Ran Out of Food in the Last Year: Never true  Transportation Needs: No Transportation Needs (04/22/2023)   PRAPARE - Administrator, Civil Service  (Medical): No    Lack of Transportation (Non-Medical): No  Physical Activity: Sufficiently Active (04/22/2023)   Exercise Vital Sign    Days of Exercise per Week: 3 days    Minutes of Exercise per Session: 60 min  Stress: No Stress Concern Present (04/22/2023)   [employer] of Occupational Health - Occupational Stress Questionnaire    Feeling of Stress : Only a little  Social Connections: Socially Integrated (04/22/2023)   Social Connection and Isolation Panel [NHANES]    Frequency of Communication with Friends and Family: More than three times a week    Frequency of Social Gatherings with Friends and Family: Once a week    Attends Religious Services: More than 4 times per year    Active Member of [employer] or Organizations: Yes    Attends Engineer, structural: More than 4 times per year    Marital Status: Married  Catering manager Violence: Not on file     Current Outpatient Medications  Medication Instructions   Multiple Vitamin (MULTIVITAMIN) capsule 1 capsule, Daily   traMADol (ULTRAM) 50 mg, Oral, Every 6 hours PRN       Objective:   Physical Exam BP 126/64   Pulse 95   Temp 97.8 F (36.6 C) (Oral)   Resp 16   Ht 5\' 10"  (1.778 m)  Wt 170 lb 8 oz (77.3 kg)   SpO2 97%   BMI 24.46 kg/m  General:   Well developed, NAD, BMI noted.  HEENT:  Normocephalic . Face symmetric, atraumatic Lungs:  CTA B Normal respiratory effort, no intercostal retractions, no accessory muscle use. Heart: RRR,  no murmur.  Abdomen:  Not distended, soft, non-tender. No rebound or rigidity.  Well-healed surgical scar Skin: Not pale. Not jaundice Lower extremities: no pretibial edema bilaterally  Neurologic:  alert & oriented X3.  Speech normal, gait appropriate for age and unassisted Psych--  Cognition and judgment appear intact.  Cooperative with normal attention span and concentration.  Behavior appropriate. No anxious or depressed appearing.     Assessment    Problem  list Increase LFTs 2016, hep B and C negative 06-2022: Calcium coronary score 0  PLAN Here for CPX Td 2017 Had a flu shot COVID VAX: booster recommended Prostate cancer screening: No symptoms, PSA WNL CCS: No FH, no symptoms, 3 options discussed, we agreed on IFOB for now. Labs: Recently done, reviewed with the patient.  All okay. Exercises regularly, diet is healthy most of the time. RTC 1 year

## 2023-07-08 NOTE — Assessment & Plan Note (Signed)
Here for CPX RTC 1 year

## 2023-07-08 NOTE — Patient Instructions (Addendum)
Vaccines I recommend: Covid booster   Next visit with me in 1 year for a physical exam   Please schedule it at the front desk

## 2023-07-18 ENCOUNTER — Encounter: Payer: Self-pay | Admitting: Internal Medicine

## 2023-07-18 NOTE — Telephone Encounter (Signed)
Form completed and faxed back to [employer] at [phone]. Form sent for scanning.

## 2023-07-19 ENCOUNTER — Other Ambulatory Visit (INDEPENDENT_AMBULATORY_CARE_PROVIDER_SITE_OTHER): Payer: 59

## 2023-07-19 DIAGNOSIS — Z0000 Encounter for general adult medical examination without abnormal findings: Secondary | ICD-10-CM | POA: Diagnosis not present

## 2023-07-20 LAB — FECAL OCCULT BLOOD, IMMUNOCHEMICAL: Fecal Occult Bld: NEGATIVE

## 2023-07-21 ENCOUNTER — Encounter: Payer: Self-pay | Admitting: Internal Medicine

## 2023-10-27 ENCOUNTER — Encounter (HOSPITAL_COMMUNITY): Payer: Self-pay | Admitting: General Surgery

## 2023-12-20 ENCOUNTER — Telehealth: Admitting: Family Medicine

## 2023-12-20 DIAGNOSIS — R300 Dysuria: Secondary | ICD-10-CM

## 2023-12-20 NOTE — Progress Notes (Signed)
 E-Visit for Urinary Problems  Based on what you shared with me, I feel your condition warrants further evaluation and I recommend that you be seen for a face to face office visit.  Male bladder infections are not very common.  We worry about prostate or kidney conditions.  The standard of care is to examine the abdomen and kidneys, and to do a urine and blood test to make sure that something more serious is not going on.  We recommend that you see a provider today.  If your doctor's office is closed [place] [facility] has the following Urgent Cares:   NOTE: There will be NO CHARGE for this E-Visit   If you are having a true medical emergency, please call 911.     For an urgent face to face visit, [facility] has multiple urgent care centers for your convenience.  Click the link below for the full list of locations and hours, walk-in wait times, appointment scheduling options and driving directions:  Urgent Care - [place], [place], [place], [place], [place], [place]       Your MyChart E-visit questionnaire answers were reviewed by a board certified advanced clinical practitioner to complete your personal care plan based on your specific symptoms.  Thank you for using e-Visits.

## 2023-12-21 ENCOUNTER — Encounter: Payer: Self-pay | Admitting: Internal Medicine

## 2023-12-21 ENCOUNTER — Ambulatory Visit (INDEPENDENT_AMBULATORY_CARE_PROVIDER_SITE_OTHER): Admitting: Internal Medicine

## 2023-12-21 VITALS — BP 116/68 | HR 81 | Temp 98.0°F | Resp 16 | Ht 70.0 in | Wt 166.5 lb

## 2023-12-21 DIAGNOSIS — R300 Dysuria: Secondary | ICD-10-CM | POA: Diagnosis not present

## 2023-12-21 LAB — URINALYSIS, ROUTINE W REFLEX MICROSCOPIC
Bilirubin Urine: NEGATIVE
Hgb urine dipstick: NEGATIVE
Ketones, ur: NEGATIVE
Leukocytes,Ua: NEGATIVE
Nitrite: NEGATIVE
RBC / HPF: NONE SEEN (ref 0–?)
Specific Gravity, Urine: <=1.005 — AB (ref 1.000–1.030)
Total Protein, Urine: NEGATIVE
Urine Glucose: NEGATIVE
Urobilinogen, UA: 0.2 (ref 0.0–1.0)
WBC, UA: NONE SEEN (ref 0–?)
pH: 6 (ref 5.0–8.0)

## 2023-12-21 LAB — POC URINALSYSI DIPSTICK (AUTOMATED)
Bilirubin, UA: NEGATIVE
Blood, UA: NEGATIVE
Glucose, UA: NEGATIVE
Ketones, UA: NEGATIVE
Nitrite, UA: NEGATIVE
Protein, UA: NEGATIVE
Spec Grav, UA: 1.01 (ref 1.010–1.025)
Urobilinogen, UA: 0.2 U/dL
pH, UA: 5 (ref 5.0–8.0)

## 2023-12-21 MED ORDER — SULFAMETHOXAZOLE-TRIMETHOPRIM 800-160 MG PO TABS: 1.0000 | ORAL_TABLET | Freq: Two times a day (BID) | ORAL | 0 refills | Status: AC

## 2023-12-21 NOTE — Patient Instructions (Signed)
 Drink plenty of fluids  Start antibiotic called Bactrim for a week.  Avoid excessive sun exposure  Call if not gradually better

## 2023-12-21 NOTE — Assessment & Plan Note (Signed)
 Dysuria: 3-day history of dysuria, extremely low risk for STDs.  No prostatitis on clinical grounds. U dip showed leukocytes. Plan: UA, urine culture, empiric Bactrim, further laboratory results.  Okay to use Azo Standard if so desired.

## 2023-12-21 NOTE — Progress Notes (Signed)
   Subjective:    Patient ID: Patrick Berry, male    DOB: 03-21-1976, 48 y.o.   MRN: [id number]  DOS:  12/21/2023 Type of visit - description: Acute  Symptoms started 3 days ago, shortly after a trip to [place].  He went with his  family including his wife Reports dysuria, worse at the beginning of the urination. Denies gross hematuria, penile discharge or a GU rash. In between micturition  there is no symptoms. Denies fever or chills. No history of previous UTI. No unusual sexual activities.  Review of Systems See above   Past Medical History:  Diagnosis Date   H/O abnormal weight loss     Past Surgical History:  Procedure Laterality Date   INGUINAL HERNIA REPAIR Bilateral 05/17/2023   Procedure: LAPAROSCOPIC BILATERAL INGUINAL HERNIA REPAIR WITH MESH;  Surgeon: Rubin Calamity, MD;  Location: [facility] OR;  Service: General;  Laterality: Bilateral;    Current Outpatient Medications  Medication Instructions   Multiple Vitamin (MULTIVITAMIN) capsule 1 capsule, Daily   traMADol  (ULTRAM ) 50 mg, Oral, Every 6 hours PRN       Objective:   Physical Exam BP 116/68   Pulse 81   Temp 98 F (36.7 C) (Oral)   Resp 16   Ht 5' 10 (1.778 m)   Wt 166 lb 8 oz (75.5 kg)   SpO2 97%   BMI 23.89 kg/m  General:   Well developed, NAD, BMI noted.  HEENT:  Normocephalic . Face symmetric, atraumatic   Abdomen:  Not distended, soft, non-tender. No rebound or rigidity.  No CVA tenderness GU: Testicles normal.  No inguinal abdominal lymph nodes. Penis: Uncircumcised, no rash, no penile discharge, no blisters. DRE: Normal prostate, not enlarged or tender. Skin: Not pale. Not jaundice Lower extremities: no pretibial edema bilaterally  Neurologic:  alert & oriented X3.  Speech normal, gait appropriate for age and unassisted Psych--  Cognition and judgment appear intact.  Cooperative with normal attention span and concentration.  Behavior appropriate. No anxious or depressed  appearing.     Assessment     Problem list Increase LFTs 2016, hep B and C negative 06-2022: Calcium coronary score 0  PLAN Dysuria: 3-day history of dysuria, extremely low risk for STDs.  No prostatitis on clinical grounds. U dip showed leukocytes. Plan: UA, urine culture, empiric Bactrim, further laboratory results.  Okay to use Azo Standard if so desired.

## 2023-12-22 LAB — URINE CULTURE
MICRO NUMBER:: 16652017
Result:: NO GROWTH
SPECIMEN QUALITY:: ADEQUATE

## 2023-12-26 ENCOUNTER — Ambulatory Visit: Payer: Self-pay | Admitting: Internal Medicine

## 2024-06-25 ENCOUNTER — Encounter: Payer: Self-pay | Admitting: Internal Medicine

## 2024-06-25 DIAGNOSIS — Z0000 Encounter for general adult medical examination without abnormal findings: Secondary | ICD-10-CM

## 2024-06-25 NOTE — Addendum Note (Signed)
 Addended by: Alianny Toelle D on: 06/25/2024 02:59 PM   Modules accepted: Orders

## 2024-06-25 NOTE — Telephone Encounter (Signed)
CMP FLP CBC A1c PSA

## 2024-06-25 NOTE — Telephone Encounter (Signed)
 Orders placed.

## 2024-07-06 ENCOUNTER — Other Ambulatory Visit (INDEPENDENT_AMBULATORY_CARE_PROVIDER_SITE_OTHER)

## 2024-07-06 DIAGNOSIS — Z0000 Encounter for general adult medical examination without abnormal findings: Secondary | ICD-10-CM

## 2024-07-06 LAB — CBC WITH DIFFERENTIAL/PLATELET
Basophils Absolute: 0 K/uL (ref 0.0–0.1)
Basophils Relative: 0.7 % (ref 0.0–3.0)
Eosinophils Absolute: 0.2 K/uL (ref 0.0–0.7)
Eosinophils Relative: 4.2 % (ref 0.0–5.0)
HCT: 42.4 % (ref 39.0–52.0)
Hemoglobin: 14.5 g/dL (ref 13.0–17.0)
Lymphocytes Relative: 39.4 % (ref 12.0–46.0)
Lymphs Abs: 1.9 K/uL (ref 0.7–4.0)
MCHC: 34.2 g/dL (ref 30.0–36.0)
MCV: 83.3 fl (ref 78.0–100.0)
Monocytes Absolute: 0.5 K/uL (ref 0.1–1.0)
Monocytes Relative: 9.3 % (ref 3.0–12.0)
Neutro Abs: 2.3 K/uL (ref 1.4–7.7)
Neutrophils Relative %: 46.4 % (ref 43.0–77.0)
Platelets: 241 K/uL (ref 150.0–400.0)
RBC: 5.09 Mil/uL (ref 4.22–5.81)
RDW: 12.9 % (ref 11.5–15.5)
WBC: 4.9 K/uL (ref 4.0–10.5)

## 2024-07-06 LAB — PSA: PSA: 1.44 ng/mL (ref 0.10–4.00)

## 2024-07-06 LAB — COMPREHENSIVE METABOLIC PANEL WITH GFR
ALT: 13 U/L (ref 3–53)
AST: 15 U/L (ref 5–37)
Albumin: 4.3 g/dL (ref 3.5–5.2)
Alkaline Phosphatase: 43 U/L (ref 39–117)
BUN: 11 mg/dL (ref 6–23)
CO2: 30 meq/L (ref 19–32)
Calcium: 9.3 mg/dL (ref 8.4–10.5)
Chloride: 103 meq/L (ref 96–112)
Creatinine, Ser: 1.05 mg/dL (ref 0.40–1.50)
GFR: 83.94 mL/min
Glucose, Bld: 95 mg/dL (ref 70–99)
Potassium: 5 meq/L (ref 3.5–5.1)
Sodium: 138 meq/L (ref 135–145)
Total Bilirubin: 0.6 mg/dL (ref 0.2–1.2)
Total Protein: 7 g/dL (ref 6.0–8.3)

## 2024-07-06 LAB — LIPID PANEL
Cholesterol: 183 mg/dL (ref 28–200)
HDL: 43.6 mg/dL
LDL Cholesterol: 111 mg/dL — ABNORMAL HIGH (ref 10–99)
NonHDL: 139.88
Total CHOL/HDL Ratio: 4
Triglycerides: 142 mg/dL (ref 10.0–149.0)
VLDL: 28.4 mg/dL (ref 0.0–40.0)

## 2024-07-06 LAB — HEMOGLOBIN A1C: Hgb A1c MFr Bld: 5.7 % (ref 4.6–6.5)

## 2024-07-09 ENCOUNTER — Ambulatory Visit: Payer: Self-pay | Admitting: Internal Medicine

## 2024-07-10 ENCOUNTER — Ambulatory Visit: Payer: 59 | Admitting: Internal Medicine

## 2024-07-10 ENCOUNTER — Encounter: Payer: Self-pay | Admitting: Internal Medicine

## 2024-07-10 VITALS — BP 116/66 | HR 74 | Temp 98.0°F | Resp 16 | Ht 70.0 in | Wt 168.5 lb

## 2024-07-10 DIAGNOSIS — R399 Unspecified symptoms and signs involving the genitourinary system: Secondary | ICD-10-CM

## 2024-07-10 DIAGNOSIS — Z0000 Encounter for general adult medical examination without abnormal findings: Secondary | ICD-10-CM | POA: Diagnosis not present

## 2024-07-10 LAB — URINALYSIS, ROUTINE W REFLEX MICROSCOPIC
Bilirubin Urine: NEGATIVE
Hgb urine dipstick: NEGATIVE
Ketones, ur: NEGATIVE
Leukocytes,Ua: NEGATIVE
Nitrite: NEGATIVE
RBC / HPF: NONE SEEN
Specific Gravity, Urine: 1.01 (ref 1.000–1.030)
Total Protein, Urine: NEGATIVE
Urine Glucose: NEGATIVE
Urobilinogen, UA: 0.2 (ref 0.0–1.0)
pH: 7.5 (ref 5.0–8.0)

## 2024-07-10 NOTE — Patient Instructions (Signed)
 Please read your instructions carefully.     Go to the front desk for the checkout Please make an appointment for your next physical in 1 year  Return the stool test when ready  Consider COVID-vaccine

## 2024-07-10 NOTE — Progress Notes (Unsigned)
 "  Subjective:    Patient ID: Patrick Berry, male    DOB: May 14, 1976, 48 y.o.   MRN: [id number]  DOS:  07/10/2024 CPX Discussed the use of AI scribe software for clinical note transcription with the patient, who gave verbal consent to proceed.  History of Present Illness Patrick Berry is a 49 year old male who presents for an annual physical exam.  General health and wellness - Feels well overall - Exercises regularly - No current complaints  Genitourinary symptoms - Occasional urinary urgency described as a rush to the bathroom - Symptoms are not bothersome and managed by voiding before activities - No dysuria, hematuria, or increased urinary frequency - Prior dysuria resolved with medication around the middle of last year  Cardiopulmonary and gastrointestinal symptoms - No chest pain - No dyspnea - No abdominal pain  Post-surgical status - Prior inguinal hernia treated last year with no current issues  Laboratory and preventive screening results - Recent blood work showed normal kidney and liver function - Cholesterol improved from 214 to 183 - A1c 5.7 - PSA checked - Prior stool test for colon cancer screening was negative    Review of Systems See above   Past Medical History:  Diagnosis Date   H/O abnormal weight loss     Past Surgical History:  Procedure Laterality Date   INGUINAL HERNIA REPAIR Bilateral 05/17/2023   Procedure: LAPAROSCOPIC BILATERAL INGUINAL HERNIA REPAIR WITH MESH;  Surgeon: Rubin Calamity, MD;  Location: [facility] OR;  Service: General;  Laterality: Bilateral;    Current Outpatient Medications  Medication Instructions   Multiple Vitamin (MULTIVITAMIN) capsule 1 capsule, Daily   sulfamethoxazole -trimethoprim  (BACTRIM  DS) 800-160 MG tablet 1 tablet, Oral, 2 times daily       Objective:   Physical Exam BP 116/66   Pulse 74   Temp 98 F (36.7 C) (Oral)   Resp 16   Ht 5' 10 (1.778 m)   Wt 168 lb 8 oz (76.4 kg)   SpO2 97%    BMI 24.18 kg/m  General: Well developed, NAD, BMI noted Neck: No  thyromegaly  HEENT:  Normocephalic . Face symmetric, atraumatic Lungs:  CTA B Normal respiratory effort, no intercostal retractions, no accessory muscle use. Heart: RRR,  no murmur.  Abdomen:  Not distended, soft, non-tender. No rebound or rigidity.   Lower extremities: no pretibial edema bilaterally  Skin: Exposed areas without rash. Not pale. Not jaundice Neurologic:  alert & oriented X3.  Speech normal, gait appropriate for age and unassisted Strength symmetric and appropriate for age.  Psych: Cognition and judgment appear intact.  Cooperative with normal attention span and concentration.  Behavior appropriate. No anxious or depressed appearing.     Assessment      Problem list Increase LFTs 2016, hep B and C negative 06-2022: Calcium coronary score 0  PLAN  Here for CPX Td 2017 Had a flu shot COVID VAX: booster recommended Prostate cancer screening: Minimal LUTS, see HPI.  DRE few months ago normal, recent PSA normal.  Recheck a UA urine culture. CCS: No FH, no symptoms, 3 options discussed again, elected Hemoccult.  Plan is to proceed with a colonoscopy at the age 2 which is completely reasonable. Labs: Recently done, reviewed, cholesterol better.  Cardiovascular risk calculated 2.4%.  Low. Lifestyle has improved compared to last year RTC 1 year The 10-year ASCVD risk score (Arnett DK, et al., 2019) is: 2.4%   Values used to calculate the score:     Age: 51 years  Clinically relevant sex: Male     Is Non-Hispanic African American: No     Diabetic: No     Tobacco smoker: No     Systolic Blood Pressure: 116 mmHg     Is BP treated: No     HDL Cholesterol: 43.6 mg/dL     Total Cholesterol: 183 mg/dL   Dysuria: 3-day history of dysuria, extremely low risk for STDs.  No prostatitis on clinical grounds. U dip showed leukocytes. Plan: UA, urine culture, empiric Bactrim , further laboratory  results.  Okay to use Azo Standard if so desired. "

## 2024-07-11 ENCOUNTER — Encounter: Payer: Self-pay | Admitting: Internal Medicine

## 2024-07-11 ENCOUNTER — Other Ambulatory Visit

## 2024-07-11 DIAGNOSIS — Z0000 Encounter for general adult medical examination without abnormal findings: Secondary | ICD-10-CM | POA: Diagnosis not present

## 2024-07-11 LAB — URINE CULTURE
MICRO NUMBER:: 17490388
Result:: NO GROWTH
SPECIMEN QUALITY:: ADEQUATE

## 2024-07-11 LAB — FECAL OCCULT BLOOD, IMMUNOCHEMICAL: Fecal Occult Bld: NEGATIVE

## 2024-07-11 NOTE — Assessment & Plan Note (Signed)
 Here for CPX Td 2017 Had a flu shot COVID VAX: booster recommended Prostate cancer screening: Minimal LUTS, see HPI.  DRE few months ago normal, recent PSA normal.  Recheck a UA urine culture. CCS: No FH, no symptoms, 3 options discussed again, elected Hemoccult this year but plans to get a cscope at age 49. Labs  Recently done, reviewed, cholesterol better.  Cardiovascular risk calculated 2.4%.  Low. Lifestyle has improved compared to last year

## 2024-07-11 NOTE — Assessment & Plan Note (Signed)
Here for CPX Doing well. RTC 1 year 

## 2024-07-12 ENCOUNTER — Ambulatory Visit: Payer: Self-pay | Admitting: Internal Medicine

## 2025-07-16 ENCOUNTER — Encounter: Admitting: Internal Medicine
